# Patient Record
Sex: Female | Born: 2000 | Race: Asian | Hispanic: No | Marital: Single | Smoking: Never smoker
Health system: Southern US, Community
[De-identification: ages and names within clinical notes are randomized; demographics above are authoritative.]

## PROBLEM LIST (undated history)

## (undated) DIAGNOSIS — R109 Unspecified abdominal pain: Secondary | ICD-10-CM

## (undated) DIAGNOSIS — K5900 Constipation, unspecified: Secondary | ICD-10-CM

## (undated) DIAGNOSIS — J302 Other seasonal allergic rhinitis: Secondary | ICD-10-CM

## (undated) HISTORY — DX: Unspecified abdominal pain: R10.9

## (undated) HISTORY — DX: Constipation, unspecified: K59.00

## (undated) HISTORY — PX: GASTROSCHISIS CLOSURE: SHX1700

---
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
## 2021-06-02 ENCOUNTER — Ambulatory Visit
Admission: RE | Admit: 2021-06-02 | Discharge: 2021-06-02 | Disposition: A | Discharge status: Home / Self Care | Payer: Managed Care, Other (non HMO) | Source: Ambulatory Visit | Attending: Pediatrics | Admitting: Pediatrics

## 2021-06-02 ENCOUNTER — Other Ambulatory Visit: Payer: Self-pay | Admitting: Pediatrics

## 2021-06-02 DIAGNOSIS — R1084 Generalized abdominal pain: Secondary | ICD-10-CM

## 2022-01-04 ENCOUNTER — Observation Stay (HOSPITAL_COMMUNITY): Payer: Managed Care, Other (non HMO)

## 2022-01-04 ENCOUNTER — Other Ambulatory Visit: Payer: Self-pay

## 2022-01-04 ENCOUNTER — Emergency Department (HOSPITAL_COMMUNITY): Payer: Managed Care, Other (non HMO)

## 2022-01-04 ENCOUNTER — Inpatient Hospital Stay (HOSPITAL_COMMUNITY)
Admission: EM | Admit: 2022-01-04 | Discharge: 2022-01-06 | DRG: 388 | Disposition: A | Discharge status: Home / Self Care | Payer: Managed Care, Other (non HMO) | Attending: Surgery | Admitting: Surgery

## 2022-01-04 ENCOUNTER — Encounter (HOSPITAL_COMMUNITY): Payer: Self-pay

## 2022-01-04 DIAGNOSIS — Z79899 Other long term (current) drug therapy: Secondary | ICD-10-CM

## 2022-01-04 DIAGNOSIS — K5651 Intestinal adhesions [bands], with partial obstruction: Secondary | ICD-10-CM | POA: Diagnosis not present

## 2022-01-04 DIAGNOSIS — Z9049 Acquired absence of other specified parts of digestive tract: Secondary | ICD-10-CM

## 2022-01-04 DIAGNOSIS — U071 COVID-19: Secondary | ICD-10-CM | POA: Diagnosis present

## 2022-01-04 DIAGNOSIS — J302 Other seasonal allergic rhinitis: Secondary | ICD-10-CM | POA: Diagnosis present

## 2022-01-04 DIAGNOSIS — K5909 Other constipation: Secondary | ICD-10-CM | POA: Diagnosis present

## 2022-01-04 DIAGNOSIS — J069 Acute upper respiratory infection, unspecified: Secondary | ICD-10-CM | POA: Diagnosis present

## 2022-01-04 DIAGNOSIS — Z87761 Personal history of (corrected) gastroschisis: Secondary | ICD-10-CM

## 2022-01-04 DIAGNOSIS — Z6828 Body mass index (BMI) 28.0-28.9, adult: Secondary | ICD-10-CM

## 2022-01-04 DIAGNOSIS — K56609 Unspecified intestinal obstruction, unspecified as to partial versus complete obstruction: Secondary | ICD-10-CM | POA: Diagnosis not present

## 2022-01-04 DIAGNOSIS — E669 Obesity, unspecified: Secondary | ICD-10-CM | POA: Diagnosis present

## 2022-01-04 DIAGNOSIS — R0600 Dyspnea, unspecified: Secondary | ICD-10-CM | POA: Diagnosis present

## 2022-01-04 DIAGNOSIS — Z0189 Encounter for other specified special examinations: Secondary | ICD-10-CM

## 2022-01-04 LAB — BASIC METABOLIC PANEL
Anion gap: 9 (ref 5–15)
BUN: 9 mg/dL (ref 6–20)
CO2: 21 mmol/L — ABNORMAL LOW (ref 22–32)
Calcium: 8.9 mg/dL (ref 8.9–10.3)
Chloride: 105 mmol/L (ref 98–111)
Creatinine, Ser: 0.68 mg/dL (ref 0.44–1.00)
GFR, Estimated: >60 mL/min (ref >60)
Glucose, Bld: 108 mg/dL — ABNORMAL HIGH (ref 70–99)
Potassium: 4.2 mmol/L (ref 3.5–5.1)
Sodium: 135 mmol/L (ref 135–145)

## 2022-01-04 LAB — URINALYSIS, ROUTINE W REFLEX MICROSCOPIC
Bacteria, UA: NONE SEEN
Bilirubin Urine: NEGATIVE
Glucose, UA: NEGATIVE mg/dL
Hgb urine dipstick: NEGATIVE
Ketones, ur: 5 mg/dL — AB
Leukocytes,Ua: NEGATIVE
Nitrite: NEGATIVE
Protein, ur: 30 mg/dL — AB
Specific Gravity, Urine: 1.026 (ref 1.005–1.030)
pH: 5 (ref 5.0–8.0)

## 2022-01-04 LAB — COMPREHENSIVE METABOLIC PANEL
ALT: 34 U/L (ref 0–44)
AST: 22 U/L (ref 15–41)
Albumin: 4.3 g/dL (ref 3.5–5.0)
Alkaline Phosphatase: 48 U/L (ref 38–126)
Anion gap: 9 (ref 5–15)
BUN: 9 mg/dL (ref 6–20)
CO2: 20 mmol/L — ABNORMAL LOW (ref 22–32)
Calcium: 8.7 mg/dL — ABNORMAL LOW (ref 8.9–10.3)
Chloride: 107 mmol/L (ref 98–111)
Creatinine, Ser: 0.58 mg/dL (ref 0.44–1.00)
GFR, Estimated: >60 mL/min (ref >60)
Glucose, Bld: 122 mg/dL — ABNORMAL HIGH (ref 70–99)
Potassium: 3.7 mmol/L (ref 3.5–5.1)
Sodium: 136 mmol/L (ref 135–145)
Total Bilirubin: 0.5 mg/dL (ref 0.3–1.2)
Total Protein: 7.1 g/dL (ref 6.5–8.1)

## 2022-01-04 LAB — PROCALCITONIN: Procalcitonin: <0.1 ng/mL

## 2022-01-04 LAB — LIPASE, BLOOD: Lipase: 36 U/L (ref 11–51)

## 2022-01-04 LAB — CBC WITH DIFFERENTIAL/PLATELET
Abs Immature Granulocytes: 0.03 10*3/uL (ref 0.00–0.07)
Basophils Absolute: 0 10*3/uL (ref 0.0–0.1)
Basophils Relative: 0 %
Eosinophils Absolute: 0 10*3/uL (ref 0.0–0.5)
Eosinophils Relative: 0 %
HCT: 40.4 % (ref 36.0–46.0)
Hemoglobin: 13.4 g/dL (ref 12.0–15.0)
Immature Granulocytes: 0 %
Lymphocytes Relative: 16 %
Lymphs Abs: 2.4 10*3/uL (ref 0.7–4.0)
MCH: 27 pg (ref 26.0–34.0)
MCHC: 33.2 g/dL (ref 30.0–36.0)
MCV: 81.5 fL (ref 80.0–100.0)
Monocytes Absolute: 1.1 10*3/uL — ABNORMAL HIGH (ref 0.1–1.0)
Monocytes Relative: 7 %
Neutro Abs: 11.5 10*3/uL — ABNORMAL HIGH (ref 1.7–7.7)
Neutrophils Relative %: 77 %
Platelets: 310 10*3/uL (ref 150–400)
RBC: 4.96 MIL/uL (ref 3.87–5.11)
RDW: 13.4 % (ref 11.5–15.5)
WBC: 15.1 10*3/uL — ABNORMAL HIGH (ref 4.0–10.5)
nRBC: 0 % (ref 0.0–0.2)

## 2022-01-04 LAB — HCG, QUANTITATIVE, PREGNANCY: hCG, Beta Chain, Quant, S: <1 m[IU]/mL (ref <5)

## 2022-01-04 LAB — HIV ANTIBODY (ROUTINE TESTING W REFLEX): HIV Screen 4th Generation wRfx: NONREACTIVE

## 2022-01-04 LAB — CBC
HCT: 40.9 % (ref 36.0–46.0)
Hemoglobin: 13.5 g/dL (ref 12.0–15.0)
MCH: 26.7 pg (ref 26.0–34.0)
MCHC: 33 g/dL (ref 30.0–36.0)
MCV: 81 fL (ref 80.0–100.0)
Platelets: 296 10*3/uL (ref 150–400)
RBC: 5.05 MIL/uL (ref 3.87–5.11)
RDW: 13.4 % (ref 11.5–15.5)
WBC: 13.3 10*3/uL — ABNORMAL HIGH (ref 4.0–10.5)
nRBC: 0 % (ref 0.0–0.2)

## 2022-01-04 LAB — RESP PANEL BY RT-PCR (FLU A&B, COVID) ARPGX2
Influenza A by PCR: NEGATIVE
Influenza B by PCR: NEGATIVE
SARS Coronavirus 2 by RT PCR: POSITIVE — AB

## 2022-01-04 LAB — FIBRINOGEN: Fibrinogen: 467 mg/dL (ref 210–475)

## 2022-01-04 LAB — MAGNESIUM: Magnesium: 1.8 mg/dL (ref 1.7–2.4)

## 2022-01-04 LAB — C-REACTIVE PROTEIN: CRP: 4.7 mg/dL — ABNORMAL HIGH (ref <1.0)

## 2022-01-04 LAB — D-DIMER, QUANTITATIVE: D-Dimer, Quant: 0.36 ug/mL-FEU (ref 0.00–0.50)

## 2022-01-04 LAB — FERRITIN: Ferritin: 40 ng/mL (ref 11–307)

## 2022-01-04 MED ORDER — PHENOL 1.4 % MT LIQD
1.0000 | OROMUCOSAL | Status: DC | PRN
  Filled 2022-01-04: qty 177

## 2022-01-04 MED ORDER — DIPHENHYDRAMINE HCL 25 MG PO CAPS: 25.0000 mg | ORAL_CAPSULE | Freq: Four times a day (QID) | ORAL | Status: DC | PRN

## 2022-01-04 MED ORDER — BENZOCAINE 20 % MT AERO: INHALATION_SPRAY | Freq: Once | OROMUCOSAL | Status: AC

## 2022-01-04 MED ORDER — DIPHENHYDRAMINE HCL 50 MG/ML IJ SOLN: 25.0000 mg | Freq: Four times a day (QID) | INTRAMUSCULAR | Status: DC | PRN

## 2022-01-04 MED ORDER — BISACODYL 10 MG RE SUPP: 10.0000 mg | Freq: Every day | RECTAL | Status: DC | PRN

## 2022-01-04 MED ORDER — OXYMETAZOLINE HCL 0.05 % NA SOLN
1.0000 | Freq: Once | NASAL | Status: AC
  Administered 2022-01-04: 1 via NASAL
  Filled 2022-01-04: qty 30

## 2022-01-04 MED ORDER — SODIUM CHLORIDE 0.9 % IV BOLUS
500.0000 mL | Freq: Once | INTRAVENOUS | Status: AC
  Administered 2022-01-04: 500 mL via INTRAVENOUS

## 2022-01-04 MED ORDER — ONDANSETRON 4 MG PO TBDP
4.0000 mg | ORAL_TABLET | Freq: Four times a day (QID) | ORAL | Status: DC | PRN
  Administered 2022-01-04: 4 mg via ORAL
  Filled 2022-01-04: qty 1

## 2022-01-04 MED ORDER — IOHEXOL 350 MG/ML SOLN
80.0000 mL | Freq: Once | INTRAVENOUS | Status: AC | PRN
  Administered 2022-01-04: 80 mL via INTRAVENOUS

## 2022-01-04 MED ORDER — SODIUM CHLORIDE 0.9 % IV SOLN
200.0000 mg | Freq: Once | INTRAVENOUS | Status: AC
  Administered 2022-01-04: 200 mg via INTRAVENOUS
  Filled 2022-01-04: qty 40

## 2022-01-04 MED ORDER — DIATRIZOATE MEGLUMINE & SODIUM 66-10 % PO SOLN
90.0000 mL | Freq: Once | ORAL | Status: AC
  Administered 2022-01-04: 90 mL via NASOGASTRIC
  Filled 2022-01-04: qty 90

## 2022-01-04 MED ORDER — SODIUM CHLORIDE 0.9 % IV SOLN: INTRAVENOUS | Status: DC

## 2022-01-04 MED ORDER — SODIUM CHLORIDE 0.9 % IV SOLN
100.0000 mg | Freq: Every day | INTRAVENOUS | Status: DC
  Administered 2022-01-05: 100 mg via INTRAVENOUS
  Filled 2022-01-04 (×2): qty 20

## 2022-01-04 MED ORDER — BISACODYL 10 MG RE SUPP
10.0000 mg | Freq: Once | RECTAL | Status: AC
  Administered 2022-01-04: 10 mg via RECTAL
  Filled 2022-01-04: qty 1

## 2022-01-04 MED ORDER — METHOCARBAMOL 1000 MG/10ML IJ SOLN
500.0000 mg | Freq: Four times a day (QID) | INTRAVENOUS | Status: DC | PRN
  Filled 2022-01-04: qty 5

## 2022-01-04 MED ORDER — ALBUTEROL SULFATE HFA 108 (90 BASE) MCG/ACT IN AERS: 2.0000 | INHALATION_SPRAY | RESPIRATORY_TRACT | Status: DC | PRN

## 2022-01-04 MED ORDER — ONDANSETRON HCL 4 MG/2ML IJ SOLN
4.0000 mg | Freq: Four times a day (QID) | INTRAMUSCULAR | Status: DC | PRN
  Administered 2022-01-04: 4 mg via INTRAVENOUS
  Filled 2022-01-04: qty 2

## 2022-01-04 MED ORDER — ENOXAPARIN SODIUM 40 MG/0.4ML IJ SOSY
40.0000 mg | PREFILLED_SYRINGE | INTRAMUSCULAR | Status: DC
  Administered 2022-01-04 – 2022-01-06 (×3): 40 mg via SUBCUTANEOUS
  Filled 2022-01-04 (×3): qty 0.4

## 2022-01-04 MED ORDER — ONDANSETRON HCL 4 MG/2ML IJ SOLN
4.0000 mg | Freq: Once | INTRAMUSCULAR | Status: AC
  Administered 2022-01-04: 4 mg via INTRAVENOUS
  Filled 2022-01-04: qty 2

## 2022-01-04 MED ORDER — ACETAMINOPHEN 10 MG/ML IV SOLN
1000.0000 mg | Freq: Four times a day (QID) | INTRAVENOUS | Status: AC
  Administered 2022-01-04 – 2022-01-05 (×4): 1000 mg via INTRAVENOUS
  Filled 2022-01-04 (×4): qty 100

## 2022-01-04 MED ORDER — SODIUM CHLORIDE (PF) 0.9 % IJ SOLN
INTRAMUSCULAR | Status: AC
  Filled 2022-01-04: qty 50

## 2022-01-04 MED ORDER — MORPHINE SULFATE (PF) 2 MG/ML IV SOLN: 2.0000 mg | INTRAVENOUS | Status: DC | PRN

## 2022-01-04 MED ORDER — MENTHOL 3 MG MT LOZG
1.0000 | LOZENGE | OROMUCOSAL | Status: DC | PRN
  Filled 2022-01-04: qty 9

## 2022-01-04 MED ORDER — IOHEXOL 300 MG/ML  SOLN: 100.0000 mL | Freq: Once | INTRAMUSCULAR | Status: DC | PRN

## 2022-01-04 NOTE — H&P (Signed)
Surgical Evaluation Requesting provider: Quincy Carnes, PA-C  Chief Complaint: Bloating, emesis  HPI: 21 year old woman with history of gastroschisis/ repair, and laparoscopic cholecystectomy with 5 hour lysis of adhesions at age 35, who presents with a 5-day history of abdominal pain and bloating and a 1 day history of multiple episodes of emesis.  She does have previous bowel obstructions that did not require surgery- this is her third in about a year.  No Known Allergies  Past Medical History:  Diagnosis Date   Abdominal pain    Constipation    Seasonal allergies     Past Surgical History:  Procedure Laterality Date   GASTROSCHISIS CLOSURE      Family History  Problem Relation Age of Onset   Cholelithiasis Maternal Aunt    Cholelithiasis Maternal Grandmother     Social History   Socioeconomic History   Marital status: Single    Spouse name: Not on file   Number of children: Not on file   Years of education: Not on file   Highest education level: Not on file  Occupational History   Not on file  Tobacco Use   Smoking status: Never   Smokeless tobacco: Never  Substance and Sexual Activity   Alcohol use: Not on file   Drug use: Not on file   Sexual activity: Not on file  Other Topics Concern   Not on file  Social History Narrative   4th grade   Social Determinants of Health   Financial Resource Strain: Not on file  Food Insecurity: Not on file  Transportation Needs: Not on file  Physical Activity: Not on file  Stress: Not on file  Social Connections: Not on file    No current facility-administered medications on file prior to encounter.   Current Outpatient Medications on File Prior to Encounter  Medication Sig Dispense Refill   belladonna-PHENObarbital (DONNATAL) 16.2 MG/5ML ELIX Take 5 mLs (16.2 mg total) by mouth 3 (three) times daily as needed for cramping. 120 mL 0   FIBER SELECT GUMMIES CHEW Chew 2 each by mouth daily. 100 tablet 0   loratadine  (CLARITIN) 5 MG chewable tablet Chew 5 mg by mouth daily.     ondansetron (ZOFRAN) 4 MG tablet Take 1 tablet (4 mg total) by mouth every 8 (eight) hours as needed for nausea or vomiting. 12 tablet 0    Review of Systems: a complete, 10pt review of systems was completed with pertinent positives and negatives as documented in the HPI  Physical Exam: Vitals:   01/04/22 0210 01/04/22 0300  BP: 105/74 108/69  Pulse: (!) 106 94  Resp: 16 16  Temp: 99.3 F (37.4 C)   SpO2: 97% 98%   Gen: A&Ox3, no distress  Unlabored respirations Abdomen soft, mildly distended, mildly tender in upper fields.   CBC Latest Ref Rng & Units 01/04/2022 01/14/2013  WBC 4.0 - 10.5 K/uL 15.1(H) 12.1  Hemoglobin 12.0 - 15.0 g/dL 13.4 14.4  Hematocrit 36.0 - 46.0 % 40.4 42.5  Platelets 150 - 400 K/uL 310 347    CMP Latest Ref Rng & Units 01/04/2022 01/14/2013  Glucose 70 - 99 mg/dL 122(H) 103(H)  BUN 6 - 20 mg/dL 9 15  Creatinine 0.44 - 1.00 mg/dL 0.58 0.55  Sodium 135 - 145 mmol/L 136 142  Potassium 3.5 - 5.1 mmol/L 3.7 3.3(L)  Chloride 98 - 111 mmol/L 107 104  CO2 22 - 32 mmol/L 20(L) 23  Calcium 8.9 - 10.3 mg/dL 8.7(L) 9.7  Total Protein  6.5 - 8.1 g/dL 7.1 8.5(H)  Total Bilirubin 0.3 - 1.2 mg/dL 0.5 0.6  Alkaline Phos 38 - 126 U/L 48 220  AST 15 - 41 U/L 22 22  ALT 0 - 44 U/L 34 20    No results found for: INR, PROTIME  Imaging: CT ABDOMEN PELVIS W CONTRAST  Result Date: 01/04/2022 CLINICAL DATA:  Abdominal pain, bloating, nausea. Bowel obstruction suspected bloating, history of SBO EXAM: CT ABDOMEN AND PELVIS WITH CONTRAST TECHNIQUE: Multidetector CT imaging of the abdomen and pelvis was performed using the standard protocol following bolus administration of intravenous contrast. RADIATION DOSE REDUCTION: This exam was performed according to the departmental dose-optimization program which includes automated exposure control, adjustment of the mA and/or kV according to patient size and/or use of  iterative reconstruction technique. CONTRAST:  75mL OMNIPAQUE IOHEXOL 350 MG/ML SOLN COMPARISON:  01/14/2013 FINDINGS: Lower chest: No acute abnormality. Hepatobiliary: No focal hepatic abnormality. Gallbladder unremarkable. Pancreas: No focal abnormality or ductal dilatation. Spleen: No focal abnormality.  Normal size. Adrenals/Urinary Tract: No adrenal abnormality. No focal renal abnormality. No stones or hydronephrosis. Urinary bladder is unremarkable. Stomach/Bowel: Large stool burden in the colon, particularly left colon. Mildly dilated upper and mid abdominal small bowel loops. Distal small bowel loops decompressed. Findings concerning for partial small bowel obstruction. Vascular/Lymphatic: No evidence of aneurysm or adenopathy. Reproductive: Uterus and adnexa unremarkable.  No mass. Other: Small amount of free fluid in the pelvis.  No free air. Musculoskeletal: No acute bony abnormality. IMPRESSION: Dilated proximal and mid small bowel loops with air-fluid levels concerning for partial small bowel obstruction. Large stool burden throughout the colon. Electronically Signed   By: Rolm Baptise M.D.   On: 01/04/2022 03:06     A/P: Small bowel obstruction in the setting of multiple previous abdominal surgeries/ known adhesive disease.  Admit for small bowel obstruction protocol. Patient inquiring about recommendations for tertiary surgeon to consider elective LOA if episodes persist, had her pediatric care at [facility]; will see if any of our partners who trained there have suggestions.    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

## 2022-01-04 NOTE — ED Provider Notes (Signed)
[facility]-EMERGENCY DEPT Provider Note   CSN: [id number] Arrival date & time: 01/04/22  0000     History  Chief Complaint  Patient presents with   Abdominal Pain   Nausea    Sabrina Pierce is a 21 y.o. female.  The history is provided by the patient and medical records.  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  Started vomiting last night, last emesis about 10 minutes prior to room assignment.  Does have history of bowel obstruction in the past.  She denies any fever.  No diarrhea.  No urinary symptoms.  Home Medications Prior to Admission medications   Medication Sig Start Date End Date Taking? Authorizing Provider  belladonna-PHENObarbital (DONNATAL) 16.2 MG/5ML ELIX Take 5 mLs (16.2 mg total) by mouth 3 (three) times daily as needed for cramping. 12/22/13   Reuben Likes, MD  FIBER SELECT GUMMIES CHEW Chew 2 each by mouth daily. 03/19/13 03/19/14  Jon Gills, MD  loratadine (CLARITIN) 5 MG chewable tablet Chew 5 mg by mouth daily.    [provider]  ondansetron (ZOFRAN) 4 MG tablet Take 1 tablet (4 mg total) by mouth every 8 (eight) hours as needed for nausea or vomiting. 12/22/13   Reuben Likes, MD      Allergies    Patient has no known allergies.    Review of Systems   Review of Systems  Gastrointestinal:  Positive for abdominal pain, nausea and vomiting.  All other systems reviewed and are negative.  Physical Exam Updated Vital Signs BP 108/69    Pulse 94    Temp 99.3 F (37.4 C) (Oral)    Resp 16    Ht 5\' 4"  (1.626 m)    Wt 75.3 kg    SpO2 98%    BMI 28.49 kg/m   Physical Exam Vitals and nursing note reviewed.  Constitutional:      Appearance: She is well-developed.  HENT:     Head: Normocephalic and atraumatic.  Eyes:     Conjunctiva/sclera: Conjunctivae  normal.     Pupils: Pupils are equal, round, and reactive to light.  Cardiovascular:     Rate and Rhythm: Normal rate and regular rhythm.     Heart sounds: Normal heart sounds.  Pulmonary:     Effort: Pulmonary effort is normal.     Breath sounds: Normal breath sounds.  Abdominal:     General: Bowel sounds are normal.     Palpations: Abdomen is soft.     Tenderness: There is no abdominal tenderness.     Comments: No significant distention appreciated, overall non-tender to palpation  Musculoskeletal:        General: Normal range of motion.     Cervical back: Normal range of motion.  Skin:    General: Skin is warm and dry.  Neurological:     Mental Status: She is alert and oriented to person, place, and time.    ED Results / Procedures / Treatments   Labs (all labs ordered are listed, but only abnormal results are displayed) Labs Reviewed  COMPREHENSIVE METABOLIC PANEL - Abnormal; Notable for the following components:      Result Value   CO2 20 (*)    Glucose, Bld 122 (*)    Calcium 8.7 (*)    All other components within normal limits  CBC WITH DIFFERENTIAL/PLATELET - Abnormal; Notable for the following components:   WBC 15.1 (*)    Neutro Abs 11.5 (*)    Monocytes Absolute 1.1 (*)    All other components within normal limits  URINALYSIS, ROUTINE W REFLEX MICROSCOPIC - Abnormal; Notable for the following components:   APPearance HAZY (*)    Ketones, ur 5 (*)    Protein, ur 30 (*)    All other components within normal limits  RESP PANEL BY RT-PCR (FLU A&B, COVID) ARPGX2  LIPASE, BLOOD  HCG, QUANTITATIVE, PREGNANCY  HIV ANTIBODY (ROUTINE TESTING W REFLEX)  BASIC METABOLIC PANEL  MAGNESIUM  CBC    EKG None  Radiology CT ABDOMEN PELVIS W CONTRAST  Result Date: 01/04/2022 CLINICAL DATA:  Abdominal pain, bloating, nausea. Bowel obstruction suspected bloating, history of SBO EXAM: CT ABDOMEN AND PELVIS WITH CONTRAST TECHNIQUE: Multidetector CT imaging of the abdomen  and pelvis was performed using the standard protocol following bolus administration of intravenous contrast. RADIATION DOSE REDUCTION: This exam was performed according to the departmental dose-optimization program which includes automated exposure control, adjustment of the mA and/or kV according to patient size and/or use of iterative reconstruction technique. CONTRAST:  72mL OMNIPAQUE IOHEXOL 350 MG/ML SOLN COMPARISON:  01/14/2013 FINDINGS: Lower chest: No acute abnormality. Hepatobiliary: No focal hepatic abnormality. Gallbladder unremarkable. Pancreas: No focal abnormality or ductal dilatation. Spleen: No focal abnormality.  Normal size. Adrenals/Urinary Tract: No adrenal abnormality. No focal renal abnormality. No stones or hydronephrosis. Urinary bladder is unremarkable. Stomach/Bowel: Large stool burden in the colon, particularly left colon. Mildly dilated upper and mid abdominal small bowel loops. Distal small bowel loops decompressed. Findings concerning for partial small bowel obstruction. Vascular/Lymphatic: No evidence of aneurysm or adenopathy. Reproductive: Uterus and adnexa unremarkable.  No mass. Other: Small amount of free fluid in the pelvis.  No free air. Musculoskeletal: No acute bony abnormality. IMPRESSION: Dilated proximal and mid small bowel loops with air-fluid levels concerning for partial small bowel obstruction. Large stool burden throughout the colon. Electronically Signed   By: Charlett Nose M.D.   On: 01/04/2022 03:06    Procedures Procedures    Medications Ordered in ED Medications  sodium chloride (PF) 0.9 % injection (has no administration in time range)  0.9 %  sodium chloride infusion (has no administration in time range)  ondansetron (ZOFRAN-ODT) disintegrating tablet 4 mg (has no administration in time range)    Or  ondansetron (ZOFRAN) injection 4 mg (has no administration in time range)  bisacodyl (DULCOLAX) suppository 10 mg (has no administration in time range)   diphenhydrAMINE (BENADRYL) capsule 25 mg (has no administration in time range)    Or  diphenhydrAMINE (BENADRYL) injection 25 mg (has no administration in time range)  diatrizoate meglumine-sodium (GASTROGRAFIN) 66-10 % solution 90 mL (has no administration in time range)  Benzocaine (HURRCAINE) 20 % mouth spray (has no administration in time range)  ondansetron (ZOFRAN) injection 4 mg (4 mg Intravenous Given 01/04/22 0217)  iohexol (OMNIPAQUE) 350 MG/ML injection 80 mL (80 mLs Intravenous Contrast Given 01/04/22 0235)    ED Course/ Medical Decision Making/ A&P                           Medical Decision Making Amount and/or Complexity of Data Reviewed External Data Reviewed: labs, radiology and notes. Labs: ordered. Radiology: ordered and independent interpretation performed. ECG/medicine tests: ordered and independent interpretation performed.  Risk Prescription drug management. Decision regarding hospitalization.   21 year old  female presenting to the ED with abdominal bloating and vomiting.  Symptoms worsening over the past 5 days.  Has history of SBO and states this feels similar.  She is afebrile and nontoxic in appearance.  She did vomit x1 prior to my assessment.  Abdomen is overall soft.  Labs with leukocytosis of 15,000, no electrolyte imbalance.  Lipase is normal.  Given her history of recurrent SBO, will obtain CT scan.  She is given Zofran.  CT reviewed, findings of partial SBO.  Will place NG tube.  Results discussed with patient and mother at bedside, they acknowledge understanding.  Will admit for ongoing care.   Discussed with on-call general surgery, Dr. Doylene Canard-- has evaluated in the ED and will admit.  Final Clinical Impression(s) / ED Diagnoses Final diagnoses:  Intestinal adhesions with partial obstruction [facility])    Rx / DC Orders ED Discharge Orders     None         Garlon Hatchet, PA-C 01/04/22 0405    Geoffery Lyons, MD 01/04/22 [phone]

## 2022-01-04 NOTE — ED Notes (Signed)
Pt given two ice packs upon request.

## 2022-01-04 NOTE — ED Triage Notes (Signed)
Pt states that she had bloating x 5 days and last night she started having abdominal pain and nausea. Pt states that she has a history of bowel obstruction.

## 2022-01-04 NOTE — Consult Note (Signed)
Initial Consultation Note   Patient: Sabrina Pierce FXJ:[id number] DOB: 04/16/2001 PCP: Pcp, No DOA: 01/04/2022 DOS: the patient was seen and examined on 01/04/2022 Primary service: Montez Morita, Md, MD  Referring physician: [facility] Reason for consult: COVID +  Assessment and Plan: Principal Problem:   Small bowel obstruction (HCC) Keep NPO. Continue NGT softening. Continue IV fluids. Analgesics as needed. Antiemetics as needed. Keep electrolytes optimized. [facility] is following.  Active Problems:   Acute respiratory disease due to COVID-19 virus Has been dyspneic at times. However, no oxygen requirement presently. Check inflammatory markers. Currently n.p.o. so will start remdesivir per pharmacy. Bronchodilators as needed. Antitussive as needed (IV narcotic/IV antiemetic for now). Follow-up CBC, CMP and inflammatory markers in a.m.  TR[facility] will continue to follow the patient.  HPI: Sabrina Pierce is a 21 y.o. female with past medical history of abdominal pain, constipation and seasonal allergies who was admitted by surgical service due to small bowel obstruction.  We are evaluating think the patient is having URI symptoms, malaise and dyspnea in the setting acute respiratory illness due to COVID.  History given by her mother due to the patient having sore throat and difficulty walking with the NG tube placement.  She has pain all yes there is no to questions I have being asked.  Review of Systems: As mentioned in the history of present illness. All other systems reviewed and are negative. Past Medical History:  Diagnosis Date   Abdominal pain    Constipation    Seasonal allergies    Past Surgical History:  Procedure Laterality Date   GASTROSCHISIS CLOSURE     Social History:  reports that she has never smoked. She has never used smokeless tobacco. No history on file for alcohol use and drug use.  No Known Allergies  Family History  Problem Relation Age of Onset   Cholelithiasis Maternal Aunt     Cholelithiasis Maternal Grandmother     Prior to Admission medications   Medication Sig Start Date End Date Taking? Authorizing Provider  FIBER SELECT GUMMIES CHEW Chew 2 each by mouth daily. Patient not taking: Reported on 01/04/2022 03/19/13 03/19/14  Jon Gills, MD    Physical Exam: Vitals:   01/04/22 1100 01/04/22 1104 01/04/22 1136 01/04/22 1200  BP: 90/68  96/69 93/63  Pulse: (!) 113  99 (!) 107  Resp:  19 18 16   Temp:  98.4 F (36.9 C)    TempSrc:  Oral    SpO2: 96%  100% 96%  Weight:      Height:       Physical Exam Vitals and nursing note reviewed. Exam conducted with a chaperone present.  Constitutional:      Appearance: She is obese. She is ill-appearing.  HENT:     Head: Normocephalic and atraumatic.     Mouth/Throat:     Mouth: Mucous membranes are moist.     Comments: NGT in place. Eyes:     Pupils: Pupils are equal, round, and reactive to light.  Cardiovascular:     Rate and Rhythm: Normal rate and regular rhythm.     Heart sounds: Normal heart sounds. No murmur heard. Pulmonary:     Effort: Pulmonary effort is normal.     Breath sounds: Normal breath sounds.  Abdominal:     General: Abdomen is flat. There is no distension.     Palpations: Abdomen is soft.     Tenderness: There is abdominal tenderness.  Skin:    General: Skin is warm and  dry.  Neurological:     General: No focal deficit present.     Mental Status: She is alert and oriented to person, place, and time.    Data Reviewed:   Results are pending, will review when available.  Family Communication:  Primary team communication:  Thank you very much for involving [place] in the care of your patient.  Author: Bobette Mo, MD 01/04/2022 12:29 PM  For on call review [email or web address].   This document was prepared using Dragon voice recognition software and may contain some unintended transcription errors.

## 2022-01-04 NOTE — Progress Notes (Signed)
Seen earlier this AM by Dr. Fredricka Bonine. Please see her consult note for more details.   S Patient reports prior to admission she had left sided abdominal pain, persistent nausea and multiple episodes of emesis. Since NGT placement her abdominal pain and nausea have resolved. She is still passing flatus (was prior to admission). She had a very firm, large bm yesterday before symptom onset. No BM since admission. Reports she frequently struggles with constipation.   Patient found to be COVID +. She had some sob and nasal congestion prior to admission. No CP or sob here. On RA. Febrile earlier today. Resolved after Tylenol. Has had 2 doses of covid vaccines. Has not received booster yet.   O Blood pressure 90/68, pulse (!) 113, temperature 98.4 F (36.9 C), temperature source Oral, resp. rate 19, height 5\' 4"  (1.626 m), weight 75.3 kg, SpO2 96 %. Gen:  Alert, NAD, pleasant HEENT: EOM's intact, pupils equal and round Card:  Tachycardic with regular rhythm Pulm:  CTAB, no W/R/R, effort normal. On RA.  Abd: Very soft, mild distension, mild periumbilical/epigastric and left sided abdominal tenderness without peritonitis. Slightly hypoactive bs. NGT w/ bilious output.   Ext:  No LE edema  Psych: A&Ox3  Skin: no rashes noted, warm and dry   A/P SBO - No indication for emergency surgery at this time - Cont NPO and NGT decompression - SBO protocol initiated. Follow up on 8 hour delayed film - Keep K > 4 and Mg > 2 for bowel function. AM labs - Mobilize for bowel function - Hopefully patient will improve with conservative management. If she fails to improve with conservative management, she may require exploration during admission.  - Patient's mother inquiring if patient was to improve with conservative management if we could refer to tertiary center as outpatient to discuss possible elective LOA.   FEN: NPO, NGT. 500cc IVF bolus followed by maintence fluid at 125cc/hr. Suppository  VTE: SCDs,  Lovenox  ID: None currently.  Foley: None   COVID + - Febrile and tachy here. Reports sob prior to admission. On RA. Pulm toilet. CXR. TRH consult.   , [facility] 11:21 AM, 01/04/22

## 2022-01-05 ENCOUNTER — Observation Stay (HOSPITAL_COMMUNITY): Payer: Managed Care, Other (non HMO)

## 2022-01-05 DIAGNOSIS — Z9049 Acquired absence of other specified parts of digestive tract: Secondary | ICD-10-CM | POA: Diagnosis not present

## 2022-01-05 DIAGNOSIS — J069 Acute upper respiratory infection, unspecified: Secondary | ICD-10-CM | POA: Diagnosis not present

## 2022-01-05 DIAGNOSIS — K5651 Intestinal adhesions [bands], with partial obstruction: Secondary | ICD-10-CM | POA: Diagnosis present

## 2022-01-05 DIAGNOSIS — U071 COVID-19: Secondary | ICD-10-CM

## 2022-01-05 DIAGNOSIS — Z6828 Body mass index (BMI) 28.0-28.9, adult: Secondary | ICD-10-CM | POA: Diagnosis not present

## 2022-01-05 DIAGNOSIS — R0600 Dyspnea, unspecified: Secondary | ICD-10-CM | POA: Diagnosis present

## 2022-01-05 DIAGNOSIS — K5909 Other constipation: Secondary | ICD-10-CM | POA: Diagnosis present

## 2022-01-05 DIAGNOSIS — Z79899 Other long term (current) drug therapy: Secondary | ICD-10-CM | POA: Diagnosis not present

## 2022-01-05 DIAGNOSIS — Z87761 Personal history of (corrected) gastroschisis: Secondary | ICD-10-CM | POA: Diagnosis not present

## 2022-01-05 DIAGNOSIS — E669 Obesity, unspecified: Secondary | ICD-10-CM | POA: Diagnosis present

## 2022-01-05 DIAGNOSIS — K56609 Unspecified intestinal obstruction, unspecified as to partial versus complete obstruction: Secondary | ICD-10-CM | POA: Diagnosis present

## 2022-01-05 DIAGNOSIS — J302 Other seasonal allergic rhinitis: Secondary | ICD-10-CM | POA: Diagnosis present

## 2022-01-05 LAB — COMPREHENSIVE METABOLIC PANEL
ALT: 26 U/L (ref 0–44)
AST: 16 U/L (ref 15–41)
Albumin: 3.6 g/dL (ref 3.5–5.0)
Alkaline Phosphatase: 41 U/L (ref 38–126)
Anion gap: 6 (ref 5–15)
BUN: 9 mg/dL (ref 6–20)
CO2: 23 mmol/L (ref 22–32)
Calcium: 8.3 mg/dL — ABNORMAL LOW (ref 8.9–10.3)
Chloride: 111 mmol/L (ref 98–111)
Creatinine, Ser: 0.41 mg/dL — ABNORMAL LOW (ref 0.44–1.00)
GFR, Estimated: >60 mL/min (ref >60)
Glucose, Bld: 83 mg/dL (ref 70–99)
Potassium: 3.5 mmol/L (ref 3.5–5.1)
Sodium: 140 mmol/L (ref 135–145)
Total Bilirubin: 0.6 mg/dL (ref 0.3–1.2)
Total Protein: 6.3 g/dL — ABNORMAL LOW (ref 6.5–8.1)

## 2022-01-05 LAB — CBC WITH DIFFERENTIAL/PLATELET
Abs Immature Granulocytes: 0.02 10*3/uL (ref 0.00–0.07)
Basophils Absolute: 0 10*3/uL (ref 0.0–0.1)
Basophils Relative: 0 %
Eosinophils Absolute: 0.1 10*3/uL (ref 0.0–0.5)
Eosinophils Relative: 2 %
HCT: 36.4 % (ref 36.0–46.0)
Hemoglobin: 11.3 g/dL — ABNORMAL LOW (ref 12.0–15.0)
Immature Granulocytes: 0 %
Lymphocytes Relative: 28 %
Lymphs Abs: 1.7 10*3/uL (ref 0.7–4.0)
MCH: 26.5 pg (ref 26.0–34.0)
MCHC: 31 g/dL (ref 30.0–36.0)
MCV: 85.4 fL (ref 80.0–100.0)
Monocytes Absolute: 0.8 10*3/uL (ref 0.1–1.0)
Monocytes Relative: 13 %
Neutro Abs: 3.3 10*3/uL (ref 1.7–7.7)
Neutrophils Relative %: 57 %
Platelets: 229 10*3/uL (ref 150–400)
RBC: 4.26 MIL/uL (ref 3.87–5.11)
RDW: 13.4 % (ref 11.5–15.5)
WBC: 5.9 10*3/uL (ref 4.0–10.5)
nRBC: 0 % (ref 0.0–0.2)

## 2022-01-05 LAB — PHOSPHORUS: Phosphorus: 3.2 mg/dL (ref 2.5–4.6)

## 2022-01-05 LAB — D-DIMER, QUANTITATIVE: D-Dimer, Quant: <0.27 ug/mL-FEU (ref 0.00–0.50)

## 2022-01-05 LAB — FERRITIN: Ferritin: 48 ng/mL (ref 11–307)

## 2022-01-05 LAB — MAGNESIUM: Magnesium: 2 mg/dL (ref 1.7–2.4)

## 2022-01-05 LAB — C-REACTIVE PROTEIN: CRP: 4.4 mg/dL — ABNORMAL HIGH (ref <1.0)

## 2022-01-05 MED ORDER — FLEET ENEMA 7-19 GM/118ML RE ENEM
1.0000 | ENEMA | Freq: Once | RECTAL | Status: AC
  Administered 2022-01-05: 1 via RECTAL
  Filled 2022-01-05: qty 1

## 2022-01-05 MED ORDER — ACETAMINOPHEN 500 MG PO TABS: 1000.0000 mg | ORAL_TABLET | Freq: Four times a day (QID) | ORAL | Status: DC | PRN

## 2022-01-05 MED ORDER — POTASSIUM CHLORIDE 10 MEQ/100ML IV SOLN
10.0000 meq | INTRAVENOUS | Status: AC
  Administered 2022-01-05 (×4): 10 meq via INTRAVENOUS
  Filled 2022-01-05 (×3): qty 100

## 2022-01-05 NOTE — Progress Notes (Addendum)
Subjective: CC: Doing well. Abdominal pain has resolved. No nausea. She is still passing flatus with 2 small episodes since I saw her yesterday morning. BM after suppository yesterday that was multiple small hard pieces. Mobilizing in the room. NGT w/ 1.225L/24 hours, bilious.   Objective: Vital signs in last 24 hours: Temp:  [98.1 F (36.7 C)-98.9 F (37.2 C)] 98.6 F (37 C) (02/09 0610) Pulse Rate:  [63-121] 63 (02/09 0610) Resp:  [15-20] 16 (02/09 0610) BP: (90-126)/(60-88) 116/60 (02/09 0610) SpO2:  [93 %-100 %] 100 % (02/09 0610) Last BM Date: 01/04/22  Intake/Output from previous day: 02/08 0701 - 02/09 0700 In: 3453.5 [I.V.:2502.2; IV Piggyback:951.3] Out: 1225 [Emesis/NG output:1225] Intake/Output this shift: No intake/output data recorded.  PE: Gen:  Alert, NAD, pleasant HEENT: EOM's intact, pupils equal and round Card:  Reg Pulm: Normal rate and effort. On RA.  Abd: Soft, ND, very mild tenderness right of her umbilicus and in the LLQ. No peritonitis. +BS. NGT output bilious with 1.225L/24 hours Ext:  No LE edema  Psych: A&Ox3  Skin: no rashes noted, warm and dry  Lab Results:  Recent Labs    01/04/22 0350 01/05/22 0356  WBC 13.3* 5.9  HGB 13.5 11.3*  HCT 40.9 36.4  PLT 296 229   BMET Recent Labs    01/04/22 0350 01/05/22 0356  NA 135 140  K 4.2 3.5  CL 105 111  CO2 21* 23  GLUCOSE 108* 83  BUN 9 9  CREATININE 0.68 0.41*  CALCIUM 8.9 8.3*   PT/INR No results for input(s): LABPROT, INR in the last 72 hours. CMP     Component Value Date/Time   NA 140 01/05/2022 0356   K 3.5 01/05/2022 0356   CL 111 01/05/2022 0356   CO2 23 01/05/2022 0356   GLUCOSE 83 01/05/2022 0356   BUN 9 01/05/2022 0356   CREATININE 0.41 (L) 01/05/2022 0356   CALCIUM 8.3 (L) 01/05/2022 0356   PROT 6.3 (L) 01/05/2022 0356   ALBUMIN 3.6 01/05/2022 0356   AST 16 01/05/2022 0356   ALT 26 01/05/2022 0356   ALKPHOS 41 01/05/2022 0356   BILITOT 0.6 01/05/2022  0356   GFRNONAA >60 01/05/2022 0356   GFRAA NOT CALCULATED 01/14/2013 1248   Lipase     Component Value Date/Time   LIPASE 36 01/04/2022 0024    Studies/Results: CT ABDOMEN PELVIS W CONTRAST  Result Date: 01/04/2022 CLINICAL DATA:  Abdominal pain, bloating, nausea. Bowel obstruction suspected bloating, history of SBO EXAM: CT ABDOMEN AND PELVIS WITH CONTRAST TECHNIQUE: Multidetector CT imaging of the abdomen and pelvis was performed using the standard protocol following bolus administration of intravenous contrast. RADIATION DOSE REDUCTION: This exam was performed according to the departmental dose-optimization program which includes automated exposure control, adjustment of the mA and/or kV according to patient size and/or use of iterative reconstruction technique. CONTRAST:  108mL OMNIPAQUE IOHEXOL 350 MG/ML SOLN COMPARISON:  01/14/2013 FINDINGS: Lower chest: No acute abnormality. Hepatobiliary: No focal hepatic abnormality. Gallbladder unremarkable. Pancreas: No focal abnormality or ductal dilatation. Spleen: No focal abnormality.  Normal size. Adrenals/Urinary Tract: No adrenal abnormality. No focal renal abnormality. No stones or hydronephrosis. Urinary bladder is unremarkable. Stomach/Bowel: Large stool burden in the colon, particularly left colon. Mildly dilated upper and mid abdominal small bowel loops. Distal small bowel loops decompressed. Findings concerning for partial small bowel obstruction. Vascular/Lymphatic: No evidence of aneurysm or adenopathy. Reproductive: Uterus and adnexa unremarkable.  No mass. Other: Small amount of  free fluid in the pelvis.  No free air. Musculoskeletal: No acute bony abnormality. IMPRESSION: Dilated proximal and mid small bowel loops with air-fluid levels concerning for partial small bowel obstruction. Large stool burden throughout the colon. Electronically Signed   By: Rolm Baptise M.D.   On: 01/04/2022 03:06   DG CHEST PORT 1 VIEW  Result Date:  01/04/2022 CLINICAL DATA:  8 hour delay abdominal x-ray for small-bowel obstruction. EXAM: PORTABLE CHEST 1 VIEW COMPARISON:  None. FINDINGS: Cardiac silhouette and mediastinal contours are within normal limits. The lungs are clear. No pleural effusion or pneumothorax. No acute skeletal abnormality. Enteric tube descends below the diaphragm with the tip overlying the stomach in the left upper quadrant. IMPRESSION: No active pulmonary disease. Enteric tube descends below the diaphragm with the tip overlying the stomach in the left upper quadrant. Electronically Signed   By: Yvonne Kendall M.D.   On: 01/04/2022 14:15   DG Abd Portable 1V-Small Bowel Obstruction Protocol-24 hr delay  Result Date: 01/05/2022 CLINICAL DATA:  Follow-up small bowel obstruction. EXAM: PORTABLE ABDOMEN - 1 VIEW COMPARISON:  Most recent film yesterday at 1:54 p.m. FINDINGS: 4:59 a.m., 01/05/2022. Enteric tube terminates in the body of the stomach. No dilated small bowel is seen on the current exam. This could indicate either decompression of the previously dilated small bowel or fluid filling of it. There is dense stool in the normal caliber colon, with aeration in the colon least as far as the distal descending colon. Visceral shadows are stable. IMPRESSION: Dilated small bowel is not seen on current exam. This could indicate fluid filling of dilated bowel or decompression. There is increasingly dense stool in the normal caliber colon. Electronically Signed   By: Telford Nab M.D.   On: 01/05/2022 07:08   DG Abd Portable 1V-Small Bowel Obstruction Protocol-initial, 8 hr delay  Result Date: 01/04/2022 CLINICAL DATA:  Follow-up small bowel obstruction EXAM: PORTABLE ABDOMEN - 1 VIEW COMPARISON:  07/04/2022 at 0342 hours FINDINGS: NG tube is not seen, and may have been removed or is located outside of the field of view. Mildly dilated small bowel within the left upper quadrant measuring up to 2.9 cm in diameter, improving from prior  (previously measured 5.4 cm). Air is seen throughout the colon. Moderate colonic stool volume. No gross free intraperitoneal air. IMPRESSION: 1. Improving small bowel dilation suggesting resolving obstruction. 2. NG tube is not seen, and may have been removed or is located outside of the field of view. Correlate with exam. Electronically Signed   By: Davina Poke D.O.   On: 01/04/2022 14:16   DG Abd Portable 1V-Small Bowel Protocol-Position Verification  Result Date: 01/04/2022 CLINICAL DATA:  21 year old female status post nasogastric tube placement. EXAM: PORTABLE ABDOMEN - 1 VIEW COMPARISON:  Abdominal radiograph 06/02/2021. FINDINGS: Tip of nasogastric tube is in the proximal stomach with side port at or immediately beyond the gastroesophageal junction. Several several dilated loops of small bowel are noted in the left upper quadrant of the abdomen measuring up to 5.4 cm in diameter. Gas and stool is noted throughout the colon and rectum. Iodinated contrast material is noted within the collecting systems of both kidneys and in the ureters and urinary bladder, related to recent contrast enhanced CT examination. IMPRESSION: 1. Nasogastric tube is in the stomach but could be advanced approximately 10 cm for more optimal placement. 2. Persistent evidence of small bowel obstruction, as above. Electronically Signed   By: Vinnie Langton M.D.   On: 01/04/2022 05:03  Anti-infectives: Anti-infectives (From admission, onward)    Start     Dose/Rate Route Frequency Ordered Stop   01/05/22 1000  remdesivir 100 mg in sodium chloride 0.9 % 100 mL IVPB       See Hyperspace for full Linked Orders Report.   100 mg 200 mL/hr over 30 Minutes Intravenous Daily 01/04/22 1228 01/09/22 0959   01/04/22 1400  remdesivir 200 mg in sodium chloride 0.9% 250 mL IVPB       See Hyperspace for full Linked Orders Report.   200 mg 580 mL/hr over 30 Minutes Intravenous Once 01/04/22 1228 01/04/22 1415         Assessment/Plan SBO - No indication for emergency surgery at this time - SBO protocol initiated. Xray this morning without dilated small bowel visualized. Could be fluid filled. Patient with improvement in her symptoms, continues to pass flatus and with reassuring exam. NGT is still bilious however with > 1L in the last 24 hours so will do clamping trial and check residuals.  - Keep K > 4 and Mg > 2 for bowel function - Mobilize for bowel function - Hopefully patient will improve with conservative management. If she fails to improve with conservative management, she may require exploration during admission.  - Patient's mother inquired if patient was to improve with conservative management if we could refer to tertiary center as outpatient to discuss possible elective LOA.    FEN: NPO, NGT. IVF. Enema. Replace K VTE: SCDs, Lovenox  ID: Remdesivir. WBC wnl.  Foley: None    COVID + - TRH following. On Remdesivir. On RA.   This care required straightforward level of medical decision making.    LOS: 0 days    Jillyn Ledger , [facility] 01/05/2022, 8:56 AM Please see Amion for pager number during day hours 7:00am-4:30pm

## 2022-01-05 NOTE — Progress Notes (Signed)
[facility] PROGRESS NOTE    Progress Note  Sabrina Pierce  [id number] DOB: 18-Jan-2001 DOA: 01/04/2022 PCP: Pcp, No     Brief Narrative:   Sabrina Pierce is an 21 y.o. female past medical history of abdominal pain constipation seasonal allergies for small bowel obstruction we are consulted for upper respiratory symptoms was found to have incidental COVID-19 SARS-CoV-2 PCR positive      Assessment/Plan:   Small bowel obstruction ([place]) Per surgery.  Acute respiratory disease due to COVID-19 virus Greater than 98% on room air. Tmax of 101.2, started on IV remdesivir no indication for steroids. Completely asymptomatic denies any productive cough, no fevers today. No shortness of breath with ambulation.     DVT prophylaxis: lovenox Family Communication:none         Code Status:     Code Status Orders  (From admission, onward)           Start     Ordered   01/04/22 0322  Full code  Continuous        01/04/22 0323           Code Status History     This patient has a current code status but no historical code status.         IV Access:   Peripheral IV   Procedures and diagnostic studies:   CT ABDOMEN PELVIS W CONTRAST  Result Date: 01/04/2022 CLINICAL DATA:  Abdominal pain, bloating, nausea. Bowel obstruction suspected bloating, history of SBO EXAM: CT ABDOMEN AND PELVIS WITH CONTRAST TECHNIQUE: Multidetector CT imaging of the abdomen and pelvis was performed using the standard protocol following bolus administration of intravenous contrast. RADIATION DOSE REDUCTION: This exam was performed according to the departmental dose-optimization program which includes automated exposure control, adjustment of the mA and/or kV according to patient size and/or use of iterative reconstruction technique. CONTRAST:  55mL OMNIPAQUE IOHEXOL 350 MG/ML SOLN COMPARISON:  01/14/2013 FINDINGS: Lower chest: No acute abnormality. Hepatobiliary: No focal hepatic  abnormality. Gallbladder unremarkable. Pancreas: No focal abnormality or ductal dilatation. Spleen: No focal abnormality.  Normal size. Adrenals/Urinary Tract: No adrenal abnormality. No focal renal abnormality. No stones or hydronephrosis. Urinary bladder is unremarkable. Stomach/Bowel: Large stool burden in the colon, particularly left colon. Mildly dilated upper and mid abdominal small bowel loops. Distal small bowel loops decompressed. Findings concerning for partial small bowel obstruction. Vascular/Lymphatic: No evidence of aneurysm or adenopathy. Reproductive: Uterus and adnexa unremarkable.  No mass. Other: Small amount of free fluid in the pelvis.  No free air. Musculoskeletal: No acute bony abnormality. IMPRESSION: Dilated proximal and mid small bowel loops with air-fluid levels concerning for partial small bowel obstruction. Large stool burden throughout the colon. Electronically Signed   By: Rolm Baptise M.D.   On: 01/04/2022 03:06   DG CHEST PORT 1 VIEW  Result Date: 01/04/2022 CLINICAL DATA:  8 hour delay abdominal x-ray for small-bowel obstruction. EXAM: PORTABLE CHEST 1 VIEW COMPARISON:  None. FINDINGS: Cardiac silhouette and mediastinal contours are within normal limits. The lungs are clear. No pleural effusion or pneumothorax. No acute skeletal abnormality. Enteric tube descends below the diaphragm with the tip overlying the stomach in the left upper quadrant. IMPRESSION: No active pulmonary disease. Enteric tube descends below the diaphragm with the tip overlying the stomach in the left upper quadrant. Electronically Signed   By: Yvonne Kendall M.D.   On: 01/04/2022 14:15   DG Abd Portable 1V-Small Bowel Obstruction Protocol-24 hr delay  Result Date: 01/05/2022 CLINICAL DATA:  Follow-up small bowel obstruction. EXAM: PORTABLE ABDOMEN - 1 VIEW COMPARISON:  Most recent film yesterday at 1:54 p.m. FINDINGS: 4:59 a.m., 01/05/2022. Enteric tube terminates in the body of the stomach. No dilated  small bowel is seen on the current exam. This could indicate either decompression of the previously dilated small bowel or fluid filling of it. There is dense stool in the normal caliber colon, with aeration in the colon least as far as the distal descending colon. Visceral shadows are stable. IMPRESSION: Dilated small bowel is not seen on current exam. This could indicate fluid filling of dilated bowel or decompression. There is increasingly dense stool in the normal caliber colon. Electronically Signed   By: Almira Bar M.D.   On: 01/05/2022 07:08   DG Abd Portable 1V-Small Bowel Obstruction Protocol-initial, 8 hr delay  Result Date: 01/04/2022 CLINICAL DATA:  Follow-up small bowel obstruction EXAM: PORTABLE ABDOMEN - 1 VIEW COMPARISON:  07/04/2022 at 0342 hours FINDINGS: NG tube is not seen, and may have been removed or is located outside of the field of view. Mildly dilated small bowel within the left upper quadrant measuring up to 2.9 cm in diameter, improving from prior (previously measured 5.4 cm). Air is seen throughout the colon. Moderate colonic stool volume. No gross free intraperitoneal air. IMPRESSION: 1. Improving small bowel dilation suggesting resolving obstruction. 2. NG tube is not seen, and may have been removed or is located outside of the field of view. Correlate with exam. Electronically Signed   By: [name] D.O.   On: 01/04/2022 14:16   DG Abd Portable 1V-Small Bowel Protocol-Position Verification  Result Date: 01/04/2022 CLINICAL DATA:  21 year old female status post nasogastric tube placement. EXAM: PORTABLE ABDOMEN - 1 VIEW COMPARISON:  Abdominal radiograph 06/02/2021. FINDINGS: Tip of nasogastric tube is in the proximal stomach with side port at or immediately beyond the gastroesophageal junction. Several several dilated loops of small bowel are noted in the left upper quadrant of the abdomen measuring up to 5.4 cm in diameter. Gas and stool is noted throughout the colon  and rectum. Iodinated contrast material is noted within the collecting systems of both kidneys and in the ureters and urinary bladder, related to recent contrast enhanced CT examination. IMPRESSION: 1. Nasogastric tube is in the stomach but could be advanced approximately 10 cm for more optimal placement. 2. Persistent evidence of small bowel obstruction, as above. Electronically Signed   By: Trudie Reed M.D.   On: 01/04/2022 05:03     Medical Consultants:   None.   Subjective:    Lenaya Whitefield denies any shortness of breath and productive cough  Objective:    Vitals:   01/04/22 2009 01/05/22 0118 01/05/22 0610 01/05/22 0901  BP: 126/88 123/85 116/60 124/79  Pulse: (!) 104 88 63 100  Resp: 15 15 16 18   Temp: 98.1 F (36.7 C)  98.6 F (37 C)   TempSrc: Oral     SpO2: 100% 100% 100% 100%  Weight:      Height:       SpO2: 100 %   Intake/Output Summary (Last 24 hours) at 01/05/2022 0940 Last data filed at 01/05/2022 0900 Gross per 24 hour  Intake 3888.47 ml  Output 1225 ml  Net 2663.47 ml   Filed Weights   01/04/22 0005 01/04/22 0210  Weight: 75.3 kg 75.3 kg    Exam: General exam: In no acute distress. Respiratory system: Good air movement and clear to auscultation. Cardiovascular system: S1 & S2 heard, RRR.  No JVD. Gastrointestinal system: Abdomen is nondistended, soft and nontender.  Extremities: No pedal edema. Skin: No rashes, lesions or ulcers Psychiatry: Judgement and insight appear normal. Mood & affect appropriate.    Data Reviewed:    Labs: Basic Metabolic Panel: Recent Labs  Lab 01/04/22 0024 01/04/22 0350 01/05/22 0356  NA 136 135 140  K 3.7 4.2 3.5  CL 107 105 111  CO2 20* 21* 23  GLUCOSE 122* 108* 83  BUN 9 9 9   CREATININE 0.58 0.68 0.41*  CALCIUM 8.7* 8.9 8.3*  MG  --  1.8 2.0  PHOS  --   --  3.2   GFR Estimated Creatinine Clearance: 111.4 mL/min (A) (by C-G formula based on SCr of 0.41 mg/dL (L)). Liver Function Tests: Recent  Labs  Lab 01/04/22 0024 01/05/22 0356  AST 22 16  ALT 34 26  ALKPHOS 48 41  BILITOT 0.5 0.6  PROT 7.1 6.3*  ALBUMIN 4.3 3.6   Recent Labs  Lab 01/04/22 0024  LIPASE 36   No results for input(s): AMMONIA in the last 168 hours. Coagulation profile No results for input(s): INR, PROTIME in the last 168 hours. COVID-19 Labs  Recent Labs    01/04/22 1228 01/05/22 0356  DDIMER 0.36 <0.27  FERRITIN 40 48  CRP 4.7* 4.4*    Lab Results  Component Value Date   SARSCOV2NAA POSITIVE (A) 01/04/2022    CBC: Recent Labs  Lab 01/04/22 0024 01/04/22 0350 01/05/22 0356  WBC 15.1* 13.3* 5.9  NEUTROABS 11.5*  --  3.3  HGB 13.4 13.5 11.3*  HCT 40.4 40.9 36.4  MCV 81.5 81.0 85.4  PLT 310 296 229   Cardiac Enzymes: No results for input(s): CKTOTAL, CKMB, CKMBINDEX, TROPONINI in the last 168 hours. BNP (last 3 results) No results for input(s): PROBNP in the last 8760 hours. CBG: No results for input(s): GLUCAP in the last 168 hours. D-Dimer: Recent Labs    01/04/22 1228 01/05/22 0356  DDIMER 0.36 <0.27   Hgb A1c: No results for input(s): HGBA1C in the last 72 hours. Lipid Profile: No results for input(s): CHOL, HDL, LDLCALC, TRIG, CHOLHDL, LDLDIRECT in the last 72 hours. Thyroid function studies: No results for input(s): TSH, T4TOTAL, T3FREE, THYROIDAB in the last 72 hours.  Invalid input(s): FREET3 Anemia work up: Recent Labs    01/04/22 1228 01/05/22 0356  FERRITIN 40 48   Sepsis Labs: Recent Labs  Lab 01/04/22 0024 01/04/22 0350 01/04/22 1228 01/05/22 0356  PROCALCITON  --   --  <0.10  --   WBC 15.1* 13.3*  --  5.9   Microbiology Recent Results (from the past 240 hour(s))  Resp Panel by RT-PCR (Flu A&B, Covid) Nasopharyngeal Swab     Status: Abnormal   Collection Time: 01/04/22  3:50 AM   Specimen: Nasopharyngeal Swab; Nasopharyngeal(NP) swabs in vial transport medium  Result Value Ref Range Status   SARS Coronavirus 2 by RT PCR POSITIVE (A)  NEGATIVE Final    Comment: (NOTE) SARS-CoV-2 target nucleic acids are DETECTED.  The SARS-CoV-2 RNA is generally detectable in upper respiratory specimens during the acute phase of infection. Positive results are indicative of the presence of the identified virus, but do not rule out bacterial infection or co-infection with other pathogens not detected by the test. Clinical correlation with patient history and other diagnostic information is necessary to determine patient infection status. The expected result is Negative.  Fact Sheet for Patients: EntrepreneurPulse.com.au  Fact Sheet for Healthcare Providers: IncredibleEmployment.be  This  test is not yet approved or cleared by the [place] and  has been authorized for detection and/or diagnosis of SARS-CoV-2 by FDA under an Emergency Use Authorization (EUA).  This EUA will remain in effect (meaning this test can be used) for the duration of  the COVID-19 declaration under Section 564(b)(1) of the A ct, 21 U.S.C. section 360bbb-3(b)(1), unless the authorization is terminated or revoked sooner.     Influenza A by PCR NEGATIVE NEGATIVE Final   Influenza B by PCR NEGATIVE NEGATIVE Final    Comment: (NOTE) The Xpert Xpress SARS-CoV-2/FLU/RSV plus assay is intended as an aid in the diagnosis of influenza from Nasopharyngeal swab specimens and should not be used as a sole basis for treatment. Nasal washings and aspirates are unacceptable for Xpert Xpress SARS-CoV-2/FLU/RSV testing.  Fact Sheet for Patients: EntrepreneurPulse.com.au  Fact Sheet for Healthcare Providers: IncredibleEmployment.be  This test is not yet approved or cleared by the [place] FDA and has been authorized for detection and/or diagnosis of SARS-CoV-2 by FDA under an Emergency Use Authorization (EUA). This EUA will remain in effect (meaning this test can be used) for the  duration of the COVID-19 declaration under Section 564(b)(1) of the Act, 21 U.S.C. section 360bbb-3(b)(1), unless the authorization is terminated or revoked.  Performed at [facility], [place] [address], [place], [place] [address]      Medications:    enoxaparin (LOVENOX) injection  40 mg Subcutaneous Q24H   Continuous Infusions:  sodium chloride 125 mL/hr at 01/05/22 [id number]   methocarbamol (ROBAXIN) IV     potassium chloride 10 mEq (01/05/22 0936)   remdesivir 100 mg in NS 100 mL 100 mg (01/05/22 0932)      LOS: 0 days   Charlynne Cousins  [facility]  01/05/2022, 9:40 AM

## 2022-01-05 NOTE — Progress Notes (Signed)
Transition of Care [facility]) Screening Note  Patient Details  Name: Sabrina Pierce Date of Birth: [birth year]  Transition of Care [facility]) CM/SW Contact:    Ewing Schlein, LCSW Phone Number: 01/05/2022, 10:49 AM  Transition of Care Department [facility]) has reviewed patient and no TOC needs have been identified at this time. We will continue to monitor patient advancement through interdisciplinary progression rounds. If new patient transition needs arise, please place a TOC consult.

## 2022-01-05 NOTE — Progress Notes (Signed)
PT NGT has been clamped for 4 hrs. 50 cc's residual at four hour mark. Patient does not complain of nausea at this time. Will keep NGT clamped and monitor patient.

## 2022-01-06 LAB — CBC WITH DIFFERENTIAL/PLATELET
Abs Immature Granulocytes: 0.02 10*3/uL (ref 0.00–0.07)
Basophils Absolute: 0 10*3/uL (ref 0.0–0.1)
Basophils Relative: 0 %
Eosinophils Absolute: 0.2 10*3/uL (ref 0.0–0.5)
Eosinophils Relative: 2 %
HCT: 36.7 % (ref 36.0–46.0)
Hemoglobin: 11.8 g/dL — ABNORMAL LOW (ref 12.0–15.0)
Immature Granulocytes: 0 %
Lymphocytes Relative: 27 %
Lymphs Abs: 1.9 10*3/uL (ref 0.7–4.0)
MCH: 26.9 pg (ref 26.0–34.0)
MCHC: 32.2 g/dL (ref 30.0–36.0)
MCV: 83.8 fL (ref 80.0–100.0)
Monocytes Absolute: 0.7 10*3/uL (ref 0.1–1.0)
Monocytes Relative: 10 %
Neutro Abs: 4.2 10*3/uL (ref 1.7–7.7)
Neutrophils Relative %: 61 %
Platelets: 233 10*3/uL (ref 150–400)
RBC: 4.38 MIL/uL (ref 3.87–5.11)
RDW: 13.2 % (ref 11.5–15.5)
WBC: 7 10*3/uL (ref 4.0–10.5)
nRBC: 0 % (ref 0.0–0.2)

## 2022-01-06 LAB — COMPREHENSIVE METABOLIC PANEL
ALT: 24 U/L (ref 0–44)
AST: 13 U/L — ABNORMAL LOW (ref 15–41)
Albumin: 3.5 g/dL (ref 3.5–5.0)
Alkaline Phosphatase: 39 U/L (ref 38–126)
Anion gap: 7 (ref 5–15)
BUN: 5 mg/dL — ABNORMAL LOW (ref 6–20)
CO2: 21 mmol/L — ABNORMAL LOW (ref 22–32)
Calcium: 8.2 mg/dL — ABNORMAL LOW (ref 8.9–10.3)
Chloride: 107 mmol/L (ref 98–111)
Creatinine, Ser: 0.42 mg/dL — ABNORMAL LOW (ref 0.44–1.00)
GFR, Estimated: >60 mL/min (ref >60)
Glucose, Bld: 82 mg/dL (ref 70–99)
Potassium: 3.6 mmol/L (ref 3.5–5.1)
Sodium: 135 mmol/L (ref 135–145)
Total Bilirubin: 0.4 mg/dL (ref 0.3–1.2)
Total Protein: 6 g/dL — ABNORMAL LOW (ref 6.5–8.1)

## 2022-01-06 LAB — MAGNESIUM: Magnesium: 1.8 mg/dL (ref 1.7–2.4)

## 2022-01-06 LAB — D-DIMER, QUANTITATIVE: D-Dimer, Quant: <0.27 ug/mL-FEU (ref 0.00–0.50)

## 2022-01-06 LAB — FERRITIN: Ferritin: 54 ng/mL (ref 11–307)

## 2022-01-06 LAB — PHOSPHORUS: Phosphorus: 3.4 mg/dL (ref 2.5–4.6)

## 2022-01-06 LAB — C-REACTIVE PROTEIN: CRP: 2.8 mg/dL — ABNORMAL HIGH (ref <1.0)

## 2022-01-06 MED ORDER — POLYETHYLENE GLYCOL 3350 17 G PO PACK
17.0000 g | PACK | Freq: Two times a day (BID) | ORAL | Status: DC
  Administered 2022-01-06: 17 g via ORAL
  Filled 2022-01-06: qty 1

## 2022-01-06 MED ORDER — POLYETHYLENE GLYCOL 3350 17 G PO PACK: 17.0000 g | PACK | Freq: Two times a day (BID) | ORAL | 0 refills | Status: AC | PRN

## 2022-01-06 MED ORDER — POLYETHYLENE GLYCOL 3350 17 G PO PACK: 17.0000 g | PACK | Freq: Every day | ORAL | Status: DC

## 2022-01-06 MED ORDER — DOCUSATE SODIUM 100 MG PO CAPS
100.0000 mg | ORAL_CAPSULE | Freq: Two times a day (BID) | ORAL | Status: DC | PRN
  Administered 2022-01-06: 100 mg via ORAL
  Filled 2022-01-06: qty 1

## 2022-01-06 NOTE — Progress Notes (Signed)
[facility] PROGRESS NOTE    Progress Note  Sabrina Pierce  [id number] DOB: 2001-11-23 DOA: 01/04/2022 PCP: Pcp, No     Brief Narrative:   Sabrina Pierce is an 21 y.o. female past medical history of abdominal pain constipation seasonal allergies for small bowel obstruction we are consulted for upper respiratory symptoms was found to have incidental COVID-19 SARS-CoV-2 PCR positive      Assessment/Plan:   Small bowel obstruction ([place]) Per surgery.  Acute respiratory disease due to COVID-19 virus Has defervesced Tmax of 98.6, saturating 100% on room air. Discontinue remdesivir. She is completely asymptomatic. We will go ahead and sign off.     DVT prophylaxis: lovenox Family Communication:none         Code Status:     Code Status Orders  (From admission, onward)           Start     Ordered   01/04/22 0322  Full code  Continuous        01/04/22 0323           Code Status History     This patient has a current code status but no historical code status.         IV Access:   Peripheral IV   Procedures and diagnostic studies:   DG CHEST PORT 1 VIEW  Result Date: 01/04/2022 CLINICAL DATA:  8 hour delay abdominal x-ray for small-bowel obstruction. EXAM: PORTABLE CHEST 1 VIEW COMPARISON:  None. FINDINGS: Cardiac silhouette and mediastinal contours are within normal limits. The lungs are clear. No pleural effusion or pneumothorax. No acute skeletal abnormality. Enteric tube descends below the diaphragm with the tip overlying the stomach in the left upper quadrant. IMPRESSION: No active pulmonary disease. Enteric tube descends below the diaphragm with the tip overlying the stomach in the left upper quadrant. Electronically Signed   By: Yvonne Kendall M.D.   On: 01/04/2022 14:15   DG Abd Portable 1V-Small Bowel Obstruction Protocol-24 hr delay  Result Date: 01/05/2022 CLINICAL DATA:  Follow-up small bowel obstruction. EXAM: PORTABLE ABDOMEN - 1  VIEW COMPARISON:  Most recent film yesterday at 1:54 p.m. FINDINGS: 4:59 a.m., 01/05/2022. Enteric tube terminates in the body of the stomach. No dilated small bowel is seen on the current exam. This could indicate either decompression of the previously dilated small bowel or fluid filling of it. There is dense stool in the normal caliber colon, with aeration in the colon least as far as the distal descending colon. Visceral shadows are stable. IMPRESSION: Dilated small bowel is not seen on current exam. This could indicate fluid filling of dilated bowel or decompression. There is increasingly dense stool in the normal caliber colon. Electronically Signed   By: Telford Nab M.D.   On: 01/05/2022 07:08   DG Abd Portable 1V-Small Bowel Obstruction Protocol-initial, 8 hr delay  Result Date: 01/04/2022 CLINICAL DATA:  Follow-up small bowel obstruction EXAM: PORTABLE ABDOMEN - 1 VIEW COMPARISON:  07/04/2022 at 0342 hours FINDINGS: NG tube is not seen, and may have been removed or is located outside of the field of view. Mildly dilated small bowel within the left upper quadrant measuring up to 2.9 cm in diameter, improving from prior (previously measured 5.4 cm). Air is seen throughout the colon. Moderate colonic stool volume. No gross free intraperitoneal air. IMPRESSION: 1. Improving small bowel dilation suggesting resolving obstruction. 2. NG tube is not seen, and may have been removed or is located outside of the field of view. Correlate with  exam. Electronically Signed   By: Davina Poke D.O.   On: 01/04/2022 14:16     Medical Consultants:   None.   Subjective:    Sabrina Pierce no complaints this morning  Objective:    Vitals:   01/05/22 0901 01/05/22 1310 01/05/22 2200 01/06/22 0658  BP: 124/79 110/74 114/75 95/64  Pulse: 100 (!) 101 100 84  Resp: 18 18 16 17   Temp:  99.3 F (37.4 C) 99.5 F (37.5 C) 98.4 F (36.9 C)  TempSrc:  Oral Oral   SpO2: 100% 100% 100% 100%  Weight:       Height:       SpO2: 100 %   Intake/Output Summary (Last 24 hours) at 01/06/2022 0826 Last data filed at 01/05/2022 2130 Gross per 24 hour  Intake 3672.5 ml  Output --  Net 3672.5 ml    Filed Weights   01/04/22 0005 01/04/22 0210  Weight: 75.3 kg 75.3 kg    Exam: General exam: In no acute distress. Respiratory system: Good air movement and clear to auscultation. Cardiovascular system: S1 & S2 heard, RRR. No JVD. Gastrointestinal system: Abdomen is nondistended, soft and nontender.  Extremities: No pedal edema. Skin: No rashes, lesions or ulcers Psychiatry: Judgement and insight appear normal. Mood & affect appropriate.   Data Reviewed:    Labs: Basic Metabolic Panel: Recent Labs  Lab 01/04/22 0024 01/04/22 0350 01/05/22 0356 01/06/22 0415  NA 136 135 140 135  K 3.7 4.2 3.5 3.6  CL 107 105 111 107  CO2 20* 21* 23 21*  GLUCOSE 122* 108* 83 82  BUN 9 9 9  5*  CREATININE 0.58 0.68 0.41* 0.42*  CALCIUM 8.7* 8.9 8.3* 8.2*  MG  --  1.8 2.0 1.8  PHOS  --   --  3.2 3.4    GFR Estimated Creatinine Clearance: 111.4 mL/min (A) (by C-G formula based on SCr of 0.42 mg/dL (L)). Liver Function Tests: Recent Labs  Lab 01/04/22 0024 01/05/22 0356 01/06/22 0415  AST 22 16 13*  ALT 34 26 24  ALKPHOS 48 41 39  BILITOT 0.5 0.6 0.4  PROT 7.1 6.3* 6.0*  ALBUMIN 4.3 3.6 3.5    Recent Labs  Lab 01/04/22 0024  LIPASE 36    No results for input(s): AMMONIA in the last 168 hours. Coagulation profile No results for input(s): INR, PROTIME in the last 168 hours. COVID-19 Labs  Recent Labs    01/04/22 1228 01/05/22 0356 01/06/22 0415  DDIMER 0.36 <0.27 <0.27  FERRITIN 40 48 54  CRP 4.7* 4.4* 2.8*     Lab Results  Component Value Date   SARSCOV2NAA POSITIVE (A) 01/04/2022    CBC: Recent Labs  Lab 01/04/22 0024 01/04/22 0350 01/05/22 0356 01/06/22 0415  WBC 15.1* 13.3* 5.9 7.0  NEUTROABS 11.5*  --  3.3 4.2  HGB 13.4 13.5 11.3* 11.8*  HCT 40.4 40.9  36.4 36.7  MCV 81.5 81.0 85.4 83.8  PLT 310 296 229 233    Cardiac Enzymes: No results for input(s): CKTOTAL, CKMB, CKMBINDEX, TROPONINI in the last 168 hours. BNP (last 3 results) No results for input(s): PROBNP in the last 8760 hours. CBG: No results for input(s): GLUCAP in the last 168 hours. D-Dimer: Recent Labs    01/05/22 0356 01/06/22 0415  DDIMER <0.27 <0.27    Hgb A1c: No results for input(s): HGBA1C in the last 72 hours. Lipid Profile: No results for input(s): CHOL, HDL, LDLCALC, TRIG, CHOLHDL, LDLDIRECT in the last 72 hours.  Thyroid function studies: No results for input(s): TSH, T4TOTAL, T3FREE, THYROIDAB in the last 72 hours.  Invalid input(s): FREET3 Anemia work up: Recent Labs    01/05/22 0356 01/06/22 0415  FERRITIN 48 54    Sepsis Labs: Recent Labs  Lab 01/04/22 0024 01/04/22 0350 01/04/22 1228 01/05/22 0356 01/06/22 0415  PROCALCITON  --   --  <0.10  --   --   WBC 15.1* 13.3*  --  5.9 7.0    Microbiology Recent Results (from the past 240 hour(s))  Resp Panel by RT-PCR (Flu A&B, Covid) Nasopharyngeal Swab     Status: Abnormal   Collection Time: 01/04/22  3:50 AM   Specimen: Nasopharyngeal Swab; Nasopharyngeal(NP) swabs in vial transport medium  Result Value Ref Range Status   SARS Coronavirus 2 by RT PCR POSITIVE (A) NEGATIVE Final    Comment: (NOTE) SARS-CoV-2 target nucleic acids are DETECTED.  The SARS-CoV-2 RNA is generally detectable in upper respiratory specimens during the acute phase of infection. Positive results are indicative of the presence of the identified virus, but do not rule out bacterial infection or co-infection with other pathogens not detected by the test. Clinical correlation with patient history and other diagnostic information is necessary to determine patient infection status. The expected result is Negative.  Fact Sheet for Patients: EntrepreneurPulse.com.au  Fact Sheet for Healthcare  Providers: IncredibleEmployment.be  This test is not yet approved or cleared by the [place] FDA and  has been authorized for detection and/or diagnosis of SARS-CoV-2 by FDA under an Emergency Use Authorization (EUA).  This EUA will remain in effect (meaning this test can be used) for the duration of  the COVID-19 declaration under Section 564(b)(1) of the A ct, 21 U.S.C. section 360bbb-3(b)(1), unless the authorization is terminated or revoked sooner.     Influenza A by PCR NEGATIVE NEGATIVE Final   Influenza B by PCR NEGATIVE NEGATIVE Final    Comment: (NOTE) The Xpert Xpress SARS-CoV-2/FLU/RSV plus assay is intended as an aid in the diagnosis of influenza from Nasopharyngeal swab specimens and should not be used as a sole basis for treatment. Nasal washings and aspirates are unacceptable for Xpert Xpress SARS-CoV-2/FLU/RSV testing.  Fact Sheet for Patients: EntrepreneurPulse.com.au  Fact Sheet for Healthcare Providers: IncredibleEmployment.be  This test is not yet approved or cleared by the [place] FDA and has been authorized for detection and/or diagnosis of SARS-CoV-2 by FDA under an Emergency Use Authorization (EUA). This EUA will remain in effect (meaning this test can be used) for the duration of the COVID-19 declaration under Section 564(b)(1) of the Act, 21 U.S.C. section 360bbb-3(b)(1), unless the authorization is terminated or revoked.  Performed at [facility], [place] [address] [place], [place] [address]      Medications:    enoxaparin (LOVENOX) injection  40 mg Subcutaneous Q24H   polyethylene glycol  17 g Oral BID   Continuous Infusions:  sodium chloride 125 mL/hr at 01/05/22 2240   methocarbamol (ROBAXIN) IV        LOS: 1 day   Charlynne Cousins  [facility]  01/06/2022, 8:26 AM

## 2022-01-06 NOTE — Discharge Summary (Signed)
° ° °  Patient ID: Sabrina Pierce [id number] [birth year] 21 y.o.  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  She does have previous bowel obstructions that did not require surgery- this is her third in about a year.  Procedures None  Hospital Course:  This is a 21 y.o. female with a history of gastroschisis repair and laparoscopic cholecystectomy with 5 hour lysis of adhesions at age 16, who presented with 5d hx of abdominal pain and 1 day hx of emesis. Patient found to have sbo on imaging. NGT was placed and sbo protocol was initiated. Patient symptoms resolved and she had robf. NGT was removed and diet was advanced and tolerated without associated abdominal pain, n/v. She was started on Miralax BID and is to continue at discharge. Family requesting referral to [facility] to discuss possible elective LOA. I have reached out to our office to place referral. On 2/10 patient was tolerating soft diet without any abdominal pain, n/v, passing flatus, having bowel function, NT on exam and felt safe for discharge home. Diet recommendations discussed. Return precautions discussed.   Patient found to have covid on admission. Patient was febrile on HD1 and complained of some sob. CXR negative. TRH was consulted and placed on Remdesivir. Symptoms resolved and Remdesivir was d/c'd. Appreciate TRH's assistance with her  care.   Physical Exam: Gen:  Alert, NAD, pleasant Pulm: normal rate and effort  Abd: Soft, ND, NT +BS   Allergies as of 01/06/2022   No Known Allergies      Medication List     STOP taking these medications    Fiber Select Gummies Chew       TAKE these medications    polyethylene glycol 17 g packet Commonly known as: MIRALAX / GLYCOLAX Take 17 g by mouth 2 (two) times daily as needed.          Follow-up Information     [facility] Follow up.   Specialty: General Surgery Why: As needed. I have asked the office to send a referral to [facility] as you requested. Contact information: [address] [place] [address] [phone]                Signed: Leary Roca, [facility] 01/06/2022, 3:49 PM Please see Amion for pager number during day hours 7:00am-4:30pm

## 2022-01-06 NOTE — Progress Notes (Signed)
Subjective: CC: Doing great. NGT out yesterday. Tolerating cld. Reports she had 7 apple juices last night. No n/v. Denies any abdominal pain. No PRN antinausea or pain medication yesterday. Continues to pass flatus. Fleet enema around 930 yesterday morning. She had some hard stools pass after this. Later in the day she had 2 episodes of loose/watery stools with the last around 10pm. Mobilizing in the room. No sob. No other complaints.   Objective: Vital signs in last 24 hours: Temp:  [98.4 F (36.9 C)-99.5 F (37.5 C)] 98.4 F (36.9 C) (02/10 0658) Pulse Rate:  [84-101] 84 (02/10 0658) Resp:  [16-18] 17 (02/10 0658) BP: (95-124)/(64-79) 95/64 (02/10 0658) SpO2:  [100 %] 100 % (02/10 0658) Last BM Date: 01/05/22  Intake/Output from previous day: 02/09 0701 - 02/10 0700 In: 3672.5 [P.O.:1660; I.V.:1512.5; IV Piggyback:500] Out: -  Intake/Output this shift: No intake/output data recorded.  PE: Gen:  Alert, NAD, pleasant Card:  RRR Pulm:  CTAB, no W/R/R, effort normal Abd: Soft, ND, NT +BS Ext:  No LE edema  Psych: A&Ox3  Skin: no rashes noted, warm and dry  Lab Results:  Recent Labs    01/05/22 0356 01/06/22 0415  WBC 5.9 7.0  HGB 11.3* 11.8*  HCT 36.4 36.7  PLT 229 233   BMET Recent Labs    01/05/22 0356 01/06/22 0415  NA 140 135  K 3.5 3.6  CL 111 107  CO2 23 21*  GLUCOSE 83 82  BUN 9 5*  CREATININE 0.41* 0.42*  CALCIUM 8.3* 8.2*   PT/INR No results for input(s): LABPROT, INR in the last 72 hours. CMP     Component Value Date/Time   NA 135 01/06/2022 0415   K 3.6 01/06/2022 0415   CL 107 01/06/2022 0415   CO2 21 (L) 01/06/2022 0415   GLUCOSE 82 01/06/2022 0415   BUN 5 (L) 01/06/2022 0415   CREATININE 0.42 (L) 01/06/2022 0415   CALCIUM 8.2 (L) 01/06/2022 0415   PROT 6.0 (L) 01/06/2022 0415   ALBUMIN 3.5 01/06/2022 0415   AST 13 (L) 01/06/2022 0415   ALT 24 01/06/2022 0415   ALKPHOS 39 01/06/2022 0415   BILITOT 0.4 01/06/2022 0415    GFRNONAA >60 01/06/2022 0415   GFRAA NOT CALCULATED 01/14/2013 1248   Lipase     Component Value Date/Time   LIPASE 36 01/04/2022 0024    Studies/Results: DG CHEST PORT 1 VIEW  Result Date: 01/04/2022 CLINICAL DATA:  8 hour delay abdominal x-ray for small-bowel obstruction. EXAM: PORTABLE CHEST 1 VIEW COMPARISON:  None. FINDINGS: Cardiac silhouette and mediastinal contours are within normal limits. The lungs are clear. No pleural effusion or pneumothorax. No acute skeletal abnormality. Enteric tube descends below the diaphragm with the tip overlying the stomach in the left upper quadrant. IMPRESSION: No active pulmonary disease. Enteric tube descends below the diaphragm with the tip overlying the stomach in the left upper quadrant. Electronically Signed   By: Neita Garnet M.D.   On: 01/04/2022 14:15   DG Abd Portable 1V-Small Bowel Obstruction Protocol-24 hr delay  Result Date: 01/05/2022 CLINICAL DATA:  Follow-up small bowel obstruction. EXAM: PORTABLE ABDOMEN - 1 VIEW COMPARISON:  Most recent film yesterday at 1:54 p.m. FINDINGS: 4:59 a.m., 01/05/2022. Enteric tube terminates in the body of the stomach. No dilated small bowel is seen on the current exam. This could indicate either decompression of the previously dilated small bowel or fluid filling of it. There is dense stool in  the normal caliber colon, with aeration in the colon least as far as the distal descending colon. Visceral shadows are stable. IMPRESSION: Dilated small bowel is not seen on current exam. This could indicate fluid filling of dilated bowel or decompression. There is increasingly dense stool in the normal caliber colon. Electronically Signed   By: Almira Bar M.D.   On: 01/05/2022 07:08   DG Abd Portable 1V-Small Bowel Obstruction Protocol-initial, 8 hr delay  Result Date: 01/04/2022 CLINICAL DATA:  Follow-up small bowel obstruction EXAM: PORTABLE ABDOMEN - 1 VIEW COMPARISON:  07/04/2022 at 0342 hours FINDINGS: NG tube  is not seen, and may have been removed or is located outside of the field of view. Mildly dilated small bowel within the left upper quadrant measuring up to 2.9 cm in diameter, improving from prior (previously measured 5.4 cm). Air is seen throughout the colon. Moderate colonic stool volume. No gross free intraperitoneal air. IMPRESSION: 1. Improving small bowel dilation suggesting resolving obstruction. 2. NG tube is not seen, and may have been removed or is located outside of the field of view. Correlate with exam. Electronically Signed   By: [name] D.O.   On: 01/04/2022 14:16    Anti-infectives: Anti-infectives (From admission, onward)    Start     Dose/Rate Route Frequency Ordered Stop   01/05/22 1000  remdesivir 100 mg in sodium chloride 0.9 % 100 mL IVPB  Status:  Discontinued       See Hyperspace for full Linked Orders Report.   100 mg 200 mL/hr over 30 Minutes Intravenous Daily 01/04/22 1228 01/06/22 0826   01/04/22 1400  remdesivir 200 mg in sodium chloride 0.9% 250 mL IVPB       See Hyperspace for full Linked Orders Report.   200 mg 580 mL/hr over 30 Minutes Intravenous Once 01/04/22 1228 01/04/22 1415        Assessment/Plan SBO - No indication for emergency surgery at this time - Clinically improving. Patient tolerating cld without any abdominal pain, distension, n/v. She has ROBF. Exam reassuring. Advance to FLD this morning. If tolerates, will advance to soft diet with possible d/c home this PM.    FEN: FLD > ADAT to Reg VTE: SCDs, Lovenox  ID: Remdesivir d/c'd by [facility]. WBC wnl.  Foley: None    COVID + - TRH was consulted. Placed on Remdesivir, now d/c'd by their team and they have s/o   This care required straightforward level of medical decision making.    LOS: 1 day    Sabrina Pierce , [facility] 01/06/2022, 8:52 AM Please see Amion for pager number during day hours 7:00am-4:30pm

## 2022-02-08 IMAGING — CT CT ABD-PELV W/ CM
2 of 4 series · 16 of 46 positions shown, 18 images · IV contrast (OMNIPAQUE 350)
Comparison: 01/14/2013

CLINICAL DATA: Abdominal pain, bloating, nausea. Bowel obstruction
suspected bloating, history of SBO

EXAM:
CT ABDOMEN AND PELVIS WITH CONTRAST
TECHNIQUE: Multidetector CT imaging of the abdomen and pelvis was performed
using the standard protocol following bolus administration of
intravenous contrast.

[Series 2: axial st · axial · 0.68mm/px · z∈[+1256,+1661]mm · 13 of 91 slices shown, 15 images]
[im 5/91  soft-tissue]
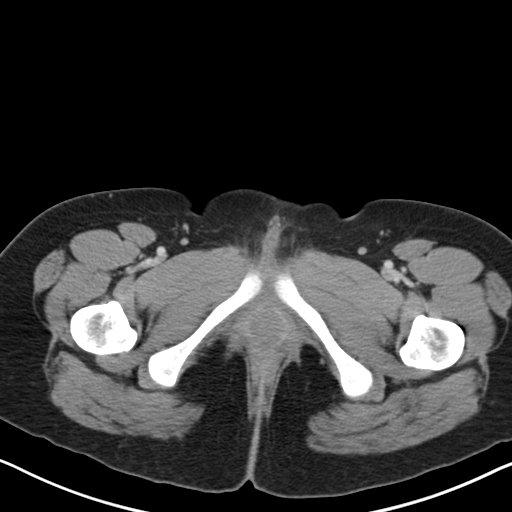
[im 5/91  bone]
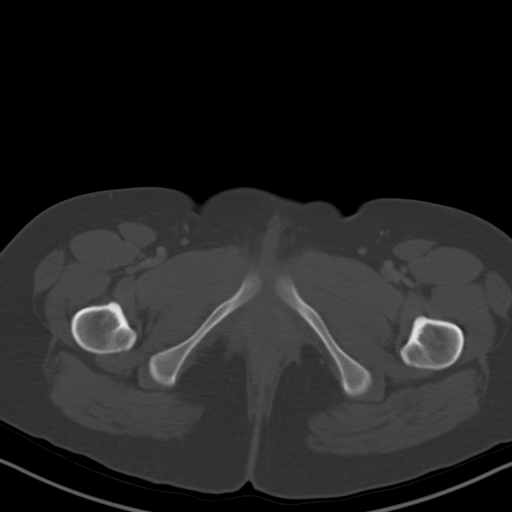
[im 13/91  soft-tissue]
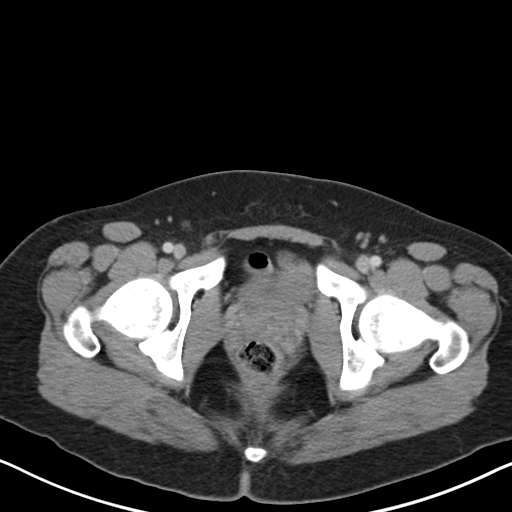
[im 21/91  soft-tissue]
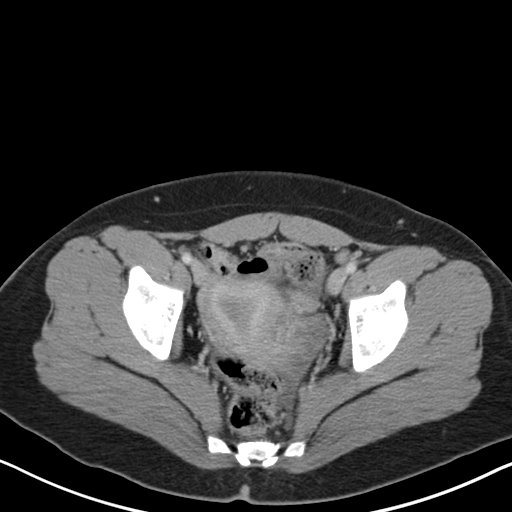
[im 25/91  soft-tissue]
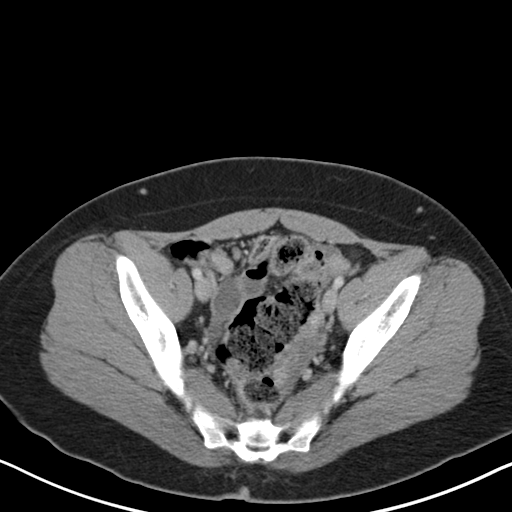
[im 33/91  soft-tissue]
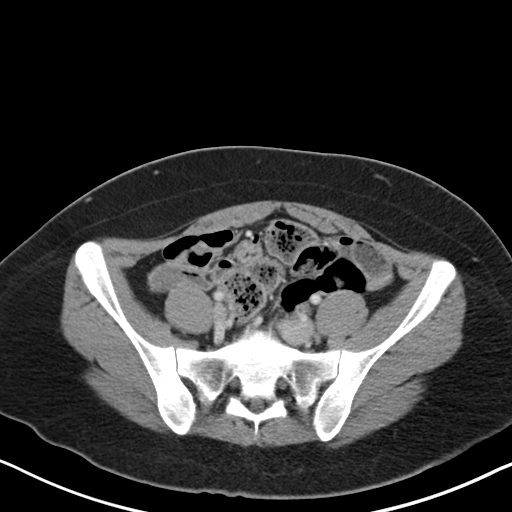
[im 37/91  soft-tissue]
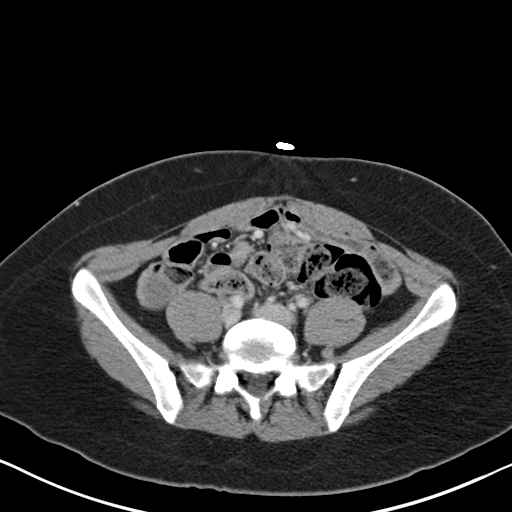
[im 46/91  soft-tissue]
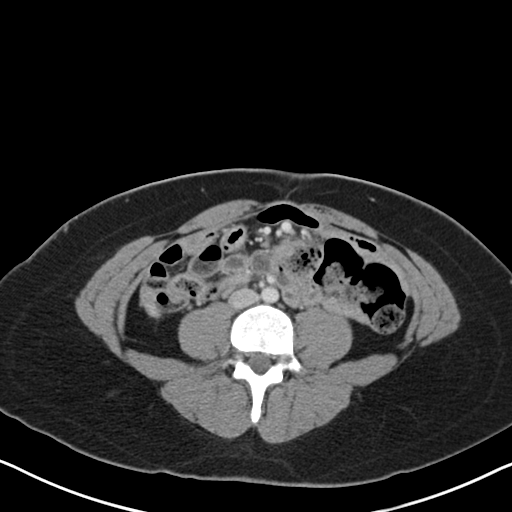
[im 54/91  soft-tissue]
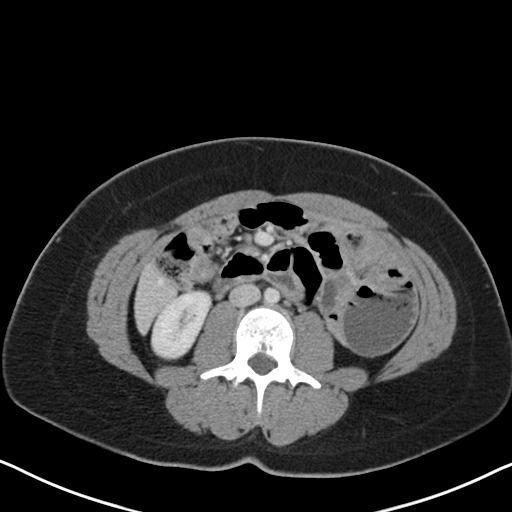
[im 58/91  soft-tissue]
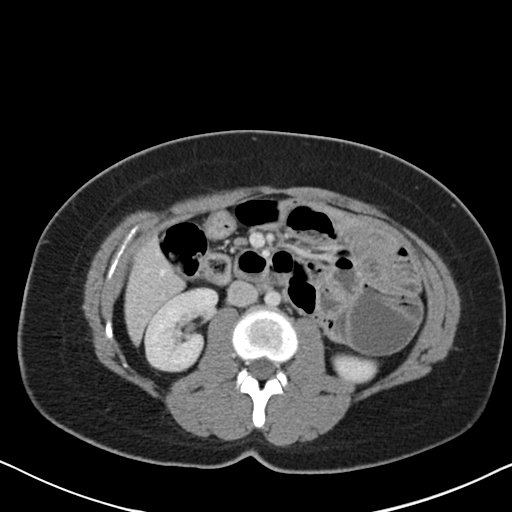
[im 58/91  bone]
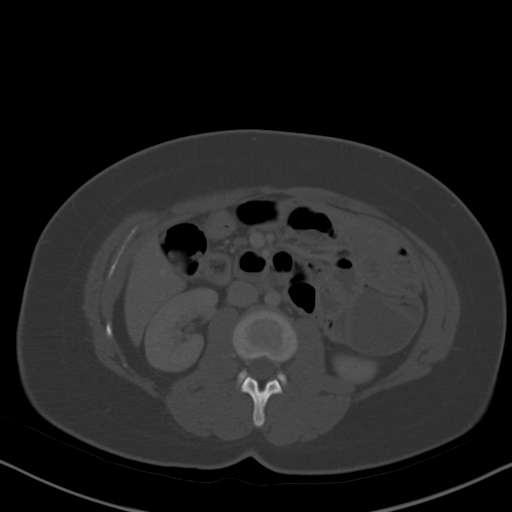
[im 66/91  soft-tissue]
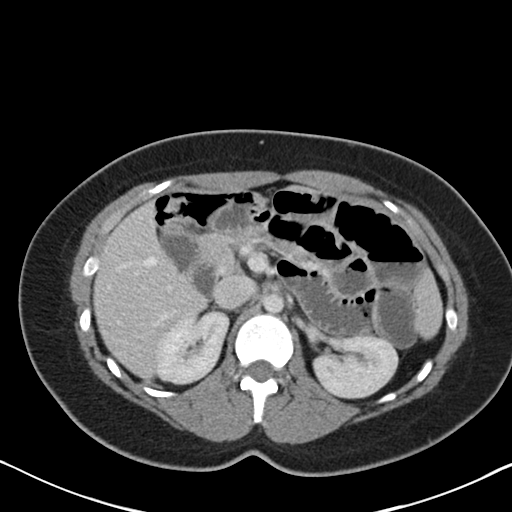
[im 70/91  soft-tissue]
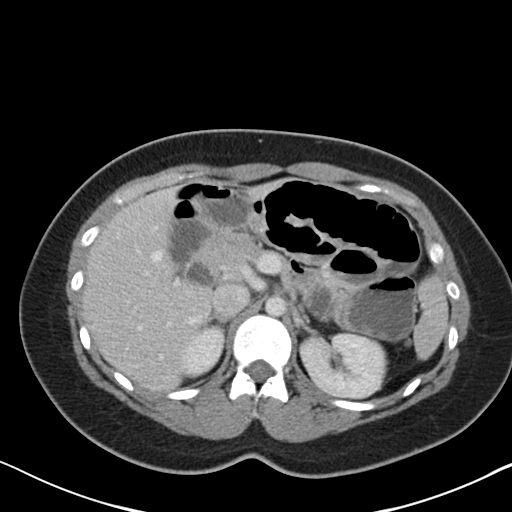
[im 78/91  soft-tissue]
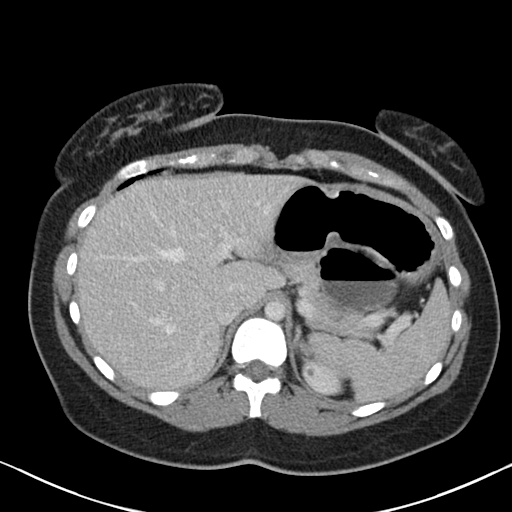
[im 86/91  soft-tissue]
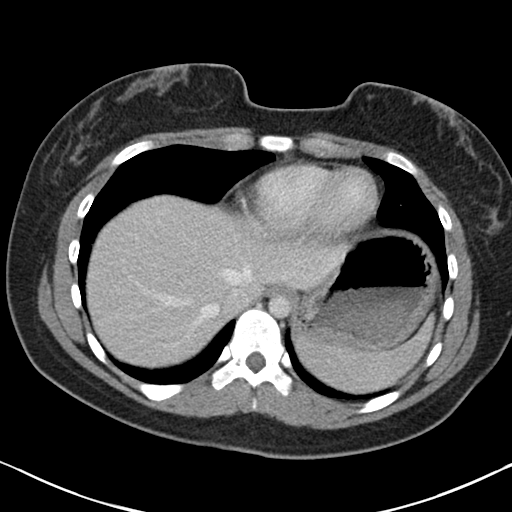

[Series 4: coronal st · coronal · 0.66mm/px · 3 of 138 slices shown]
[im 46/138  soft-tissue]
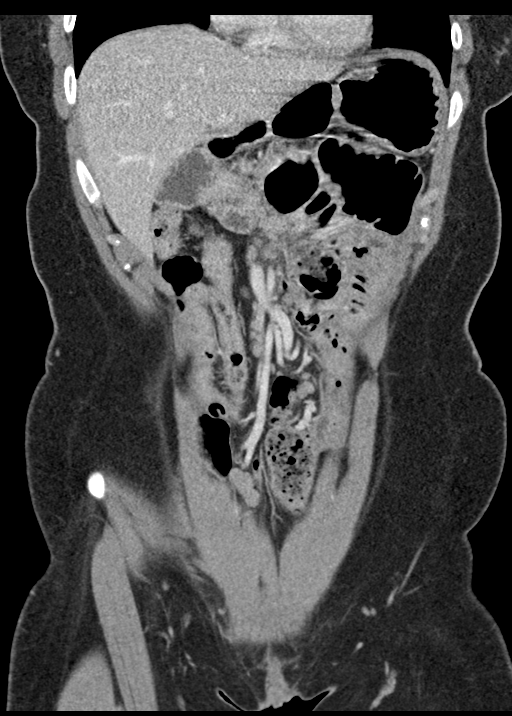
[im 61/138  soft-tissue]
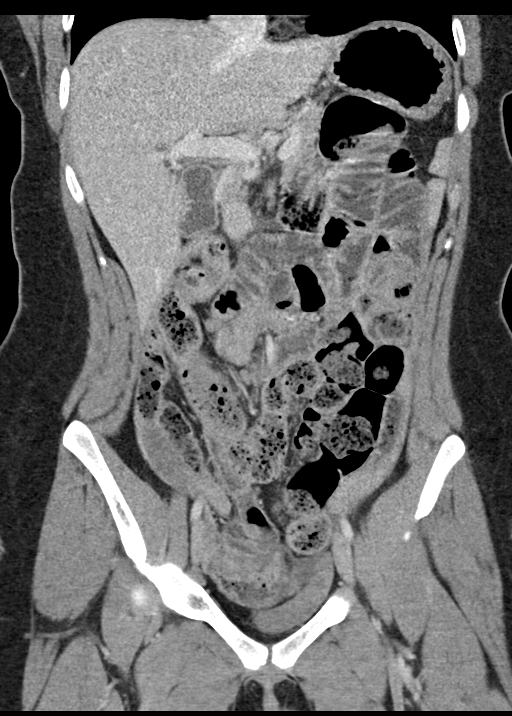
[im 77/138  soft-tissue]
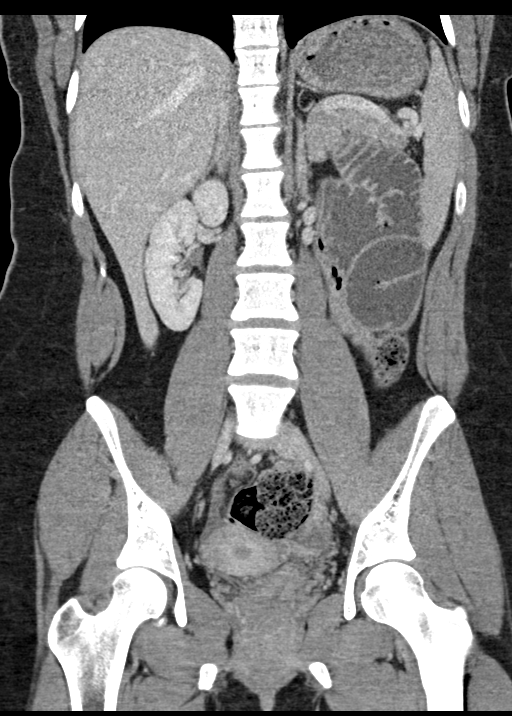

[16 of 46 positions shown; findings below may reference images not displayed]

RADIATION DOSE REDUCTION: This exam was performed according to the
departmental dose-optimization program which includes automated
exposure control, adjustment of the mA and/or kV according to
patient size and/or use of iterative reconstruction technique.

CONTRAST:  80mL OMNIPAQUE IOHEXOL 350 MG/ML SOLN
FINDINGS: Lower chest: No acute abnormality.

Hepatobiliary: No focal hepatic abnormality. Gallbladder
unremarkable.

Pancreas: No focal abnormality or ductal dilatation.

Spleen: No focal abnormality.  Normal size.

Adrenals/Urinary Tract: No adrenal abnormality. No focal renal
abnormality. No stones or hydronephrosis. Urinary bladder is
unremarkable.

Stomach/Bowel: Large stool burden in the colon, particularly left
colon. Mildly dilated upper and mid abdominal small bowel loops.
Distal small bowel loops decompressed. Findings concerning for
partial small bowel obstruction.

Vascular/Lymphatic: No evidence of aneurysm or adenopathy.

Reproductive: Uterus and adnexa unremarkable.  No mass.

Other: Small amount of free fluid in the pelvis.  No free air.

Musculoskeletal: No acute bony abnormality.
IMPRESSION: Dilated proximal and mid small bowel loops with air-fluid levels
concerning for partial small bowel obstruction.

Large stool burden throughout the colon.

## 2022-02-09 IMAGING — DX DG ABD PORTABLE 1V
1 series · 1 of 1 positions shown · non-contrast
Comparison: Most recent film yesterday at [DATE] p.m.

CLINICAL DATA: Follow-up small bowel obstruction.

EXAM:
PORTABLE ABDOMEN - 1 VIEW

[abdomen kub]
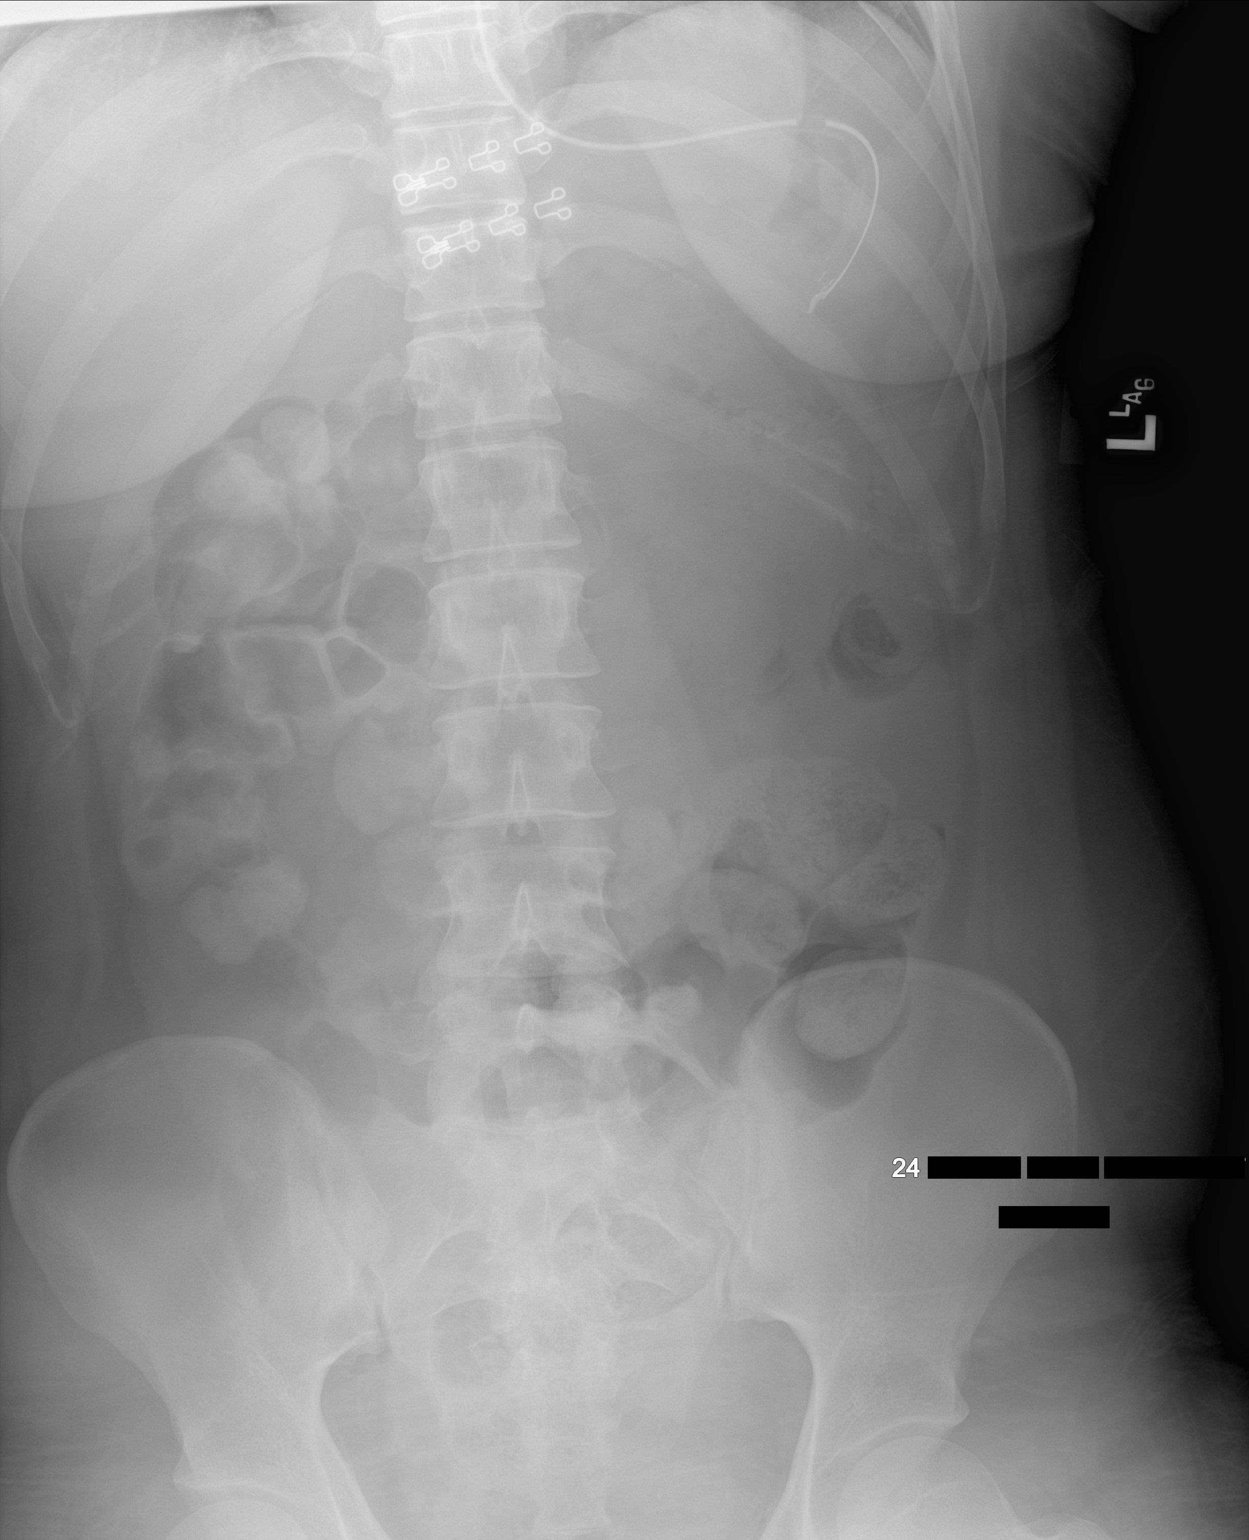

[1 of 1 positions shown; findings below may reference images not displayed]

FINDINGS: [DATE] a.m., 01/05/2022. Enteric tube terminates in the body of the
stomach. No dilated small bowel is seen on the current exam. This
could indicate either decompression of the previously dilated small
bowel or fluid filling of it.

There is dense stool in the normal caliber colon, with aeration in
the colon least as far as the distal descending colon. Visceral
shadows are stable.
IMPRESSION: Dilated small bowel is not seen on current exam. This could indicate
fluid filling of dilated bowel or decompression. There is
increasingly dense stool in the normal caliber colon.

## 2022-04-19 ENCOUNTER — Encounter: Payer: Self-pay | Admitting: Emergency Medicine

## 2022-04-19 ENCOUNTER — Ambulatory Visit (INDEPENDENT_AMBULATORY_CARE_PROVIDER_SITE_OTHER): Payer: Managed Care, Other (non HMO) | Admitting: Emergency Medicine

## 2022-04-19 VITALS — BP 112/68 | HR 88 | Temp 98.0°F | Ht 64.0 in | Wt 162.4 lb

## 2022-04-19 DIAGNOSIS — Z1159 Encounter for screening for other viral diseases: Secondary | ICD-10-CM

## 2022-04-19 DIAGNOSIS — Z8719 Personal history of other diseases of the digestive system: Secondary | ICD-10-CM | POA: Diagnosis not present

## 2022-04-19 DIAGNOSIS — Z130 Encounter for screening for diseases of the blood and blood-forming organs and certain disorders involving the immune mechanism: Secondary | ICD-10-CM | POA: Diagnosis not present

## 2022-04-19 DIAGNOSIS — Z13220 Encounter for screening for lipoid disorders: Secondary | ICD-10-CM

## 2022-04-19 DIAGNOSIS — Q793 Gastroschisis: Secondary | ICD-10-CM

## 2022-04-19 DIAGNOSIS — Z7689 Persons encountering health services in other specified circumstances: Secondary | ICD-10-CM

## 2022-04-19 DIAGNOSIS — Z13228 Encounter for screening for other metabolic disorders: Secondary | ICD-10-CM | POA: Diagnosis not present

## 2022-04-19 DIAGNOSIS — Z0000 Encounter for general adult medical examination without abnormal findings: Secondary | ICD-10-CM | POA: Diagnosis not present

## 2022-04-19 DIAGNOSIS — Z1329 Encounter for screening for other suspected endocrine disorder: Secondary | ICD-10-CM | POA: Diagnosis not present

## 2022-04-19 LAB — LIPID PANEL
Cholesterol: 140 mg/dL (ref 0–200)
HDL: 33 mg/dL — ABNORMAL LOW (ref >39.00)
LDL Cholesterol: 79 mg/dL (ref 0–99)
NonHDL: 106.88
Total CHOL/HDL Ratio: 4
Triglycerides: 137 mg/dL (ref 0.0–149.0)
VLDL: 27.4 mg/dL (ref 0.0–40.0)

## 2022-04-19 LAB — COMPREHENSIVE METABOLIC PANEL
ALT: 21 U/L (ref 0–35)
AST: 13 U/L (ref 0–37)
Albumin: 5 g/dL (ref 3.5–5.2)
Alkaline Phosphatase: 64 U/L (ref 39–117)
BUN: 12 mg/dL (ref 6–23)
CO2: 25 mEq/L (ref 19–32)
Calcium: 9.6 mg/dL (ref 8.4–10.5)
Chloride: 103 mEq/L (ref 96–112)
Creatinine, Ser: 0.79 mg/dL (ref 0.40–1.20)
GFR: 107.78 mL/min (ref >60.00)
Glucose, Bld: 87 mg/dL (ref 70–99)
Potassium: 3.9 mEq/L (ref 3.5–5.1)
Sodium: 138 mEq/L (ref 135–145)
Total Bilirubin: 0.3 mg/dL (ref 0.2–1.2)
Total Protein: 7.9 g/dL (ref 6.0–8.3)

## 2022-04-19 LAB — CBC WITH DIFFERENTIAL/PLATELET
Basophils Absolute: 0 10*3/uL (ref 0.0–0.1)
Basophils Relative: 0.2 % (ref 0.0–3.0)
Eosinophils Absolute: 0.1 10*3/uL (ref 0.0–0.7)
Eosinophils Relative: 1.7 % (ref 0.0–5.0)
HCT: 40 % (ref 36.0–46.0)
Hemoglobin: 13.4 g/dL (ref 12.0–15.0)
Lymphocytes Relative: 37.5 % (ref 12.0–46.0)
Lymphs Abs: 3.1 10*3/uL (ref 0.7–4.0)
MCHC: 33.4 g/dL (ref 30.0–36.0)
MCV: 80.2 fl (ref 78.0–100.0)
Monocytes Absolute: 0.6 10*3/uL (ref 0.1–1.0)
Monocytes Relative: 6.8 % (ref 3.0–12.0)
Neutro Abs: 4.4 10*3/uL (ref 1.4–7.7)
Neutrophils Relative %: 53.8 % (ref 43.0–77.0)
Platelets: 364 10*3/uL (ref 150.0–400.0)
RBC: 4.99 Mil/uL (ref 3.87–5.11)
RDW: 13.5 % (ref 11.5–14.6)
WBC: 8.2 10*3/uL (ref 4.5–10.5)

## 2022-04-19 LAB — HEMOGLOBIN A1C: Hgb A1c MFr Bld: 5.4 % (ref 4.6–6.5)

## 2022-04-19 NOTE — Patient Instructions (Signed)

## 2022-04-19 NOTE — Progress Notes (Signed)
Sabrina Pierce 21 y.o.   Chief Complaint  Patient presents with   New Patient (Initial Visit)    No concerns    HISTORY OF PRESENT ILLNESS: This is a 21 y.o. female first visit to this office, here to establish care with me. Has no complaints or medical concerns today. However patient has a history of recurrent bowel obstructions and has had several abdominal surgeries in the past. Needs a gastrointestinal MD local referral.  HPI   Prior to Admission medications   Medication Sig Start Date End Date Taking? Authorizing Provider  polyethylene glycol (MIRALAX / GLYCOLAX) 17 g packet Take 17 g by mouth 2 (two) times daily as needed. 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   Chronic constipation     Past Medical History:  Diagnosis Date   Abdominal pain    Constipation    Seasonal allergies     Past Surgical History:  Procedure Laterality Date   GASTROSCHISIS CLOSURE      Social History   Socioeconomic History   Marital status: Single    Spouse name: Not on file   Number of children: Not on file   Years of education: Not on file   Highest education level: Not on file  Occupational History   Not on file  Tobacco Use   Smoking status: Never   Smokeless tobacco: Never  Substance and Sexual Activity   Alcohol use: Not on file   Drug use: Not on file   Sexual activity: Not on file  Other Topics Concern   Not on file  Social History Narrative   4th grade   Social Determinants of Health   Financial Resource Strain: Not on file  Food Insecurity: Not on file  Transportation Needs: Not on file  Physical Activity: Not on file  Stress: Not on file  Social Connections: Not on file  Intimate Partner Violence: Not on file    Family History  Problem Relation Age of Onset    Cholelithiasis Maternal Aunt    Cholelithiasis Maternal Grandmother      Review of Systems  Constitutional: Negative.  Negative for chills and fever.  HENT: Negative.  Negative for congestion and sore throat.   Respiratory: Negative.  Negative for cough and shortness of breath.   Cardiovascular: Negative.  Negative for chest pain and palpitations.  Gastrointestinal: Negative.  Negative for abdominal pain, diarrhea, nausea and vomiting.  Genitourinary: Negative.   Skin: Negative.   Neurological: Negative.  Negative for dizziness and headaches.  All other systems reviewed and are negative. Today's Vitals   04/19/22 0812  BP: 112/68  Pulse: 88  Temp: 98 F (36.7 C)  TempSrc: Oral  SpO2: 98%  Weight: 162 lb 6 oz (73.7 kg)  Height: 5\' 4"  (1.626 m)   Body mass index is 27.87 kg/m.   Physical Exam Vitals reviewed.  Constitutional:      Appearance: Normal appearance.  HENT:     Head: Normocephalic.     Right Ear: Tympanic membrane, ear canal and external ear normal.     Left Ear: Tympanic membrane, ear canal and external ear normal.     Mouth/Throat:     Mouth: Mucous membranes are moist.     Pharynx: Oropharynx is clear.  Eyes:     Extraocular Movements: Extraocular movements intact.  Conjunctiva/sclera: Conjunctivae normal.     Pupils: Pupils are equal, round, and reactive to light.  Cardiovascular:     Rate and Rhythm: Normal rate and regular rhythm.     Pulses: Normal pulses.     Heart sounds: Normal heart sounds.  Pulmonary:     Effort: Pulmonary effort is normal.     Breath sounds: Normal breath sounds.  Musculoskeletal:        General: Normal range of motion.     Cervical back: Neck supple.  Lymphadenopathy:     Cervical: No cervical adenopathy.  Skin:    General: Skin is warm and dry.     Capillary Refill: Capillary refill takes less than 2 seconds.  Neurological:     General: No focal deficit present.     Mental Status: She is alert and oriented to  person, place, and time.  Psychiatric:        Mood and Affect: Mood normal.        Behavior: Behavior normal.     ASSESSMENT & PLAN: Problem List Items Addressed This Visit       Other   Congenital gastroschisis   Relevant Orders   Ambulatory referral to Gastroenterology   History of small bowel obstruction   Relevant Orders   Ambulatory referral to Gastroenterology   Other Visit Diagnoses     Routine general medical examination at a health care facility    -  Primary   Encounter to establish care       Need for hepatitis C screening test       Relevant Orders   Hepatitis C antibody screen   Screening for deficiency anemia       Relevant Orders   CBC with Differential/Platelet   Screening for lipoid disorders       Relevant Orders   Lipid panel   Screening for endocrine, metabolic and immunity disorder       Relevant Orders   Comprehensive metabolic panel   Hemoglobin A1c      Modifiable risk factors discussed with patient. Anticipatory guidance according to age provided. The following topics were also discussed: Social Determinants of Health Smoking.  Non-smoker Diet and nutrition Benefits of exercise Cancer family history review Vaccinations recommendations and review Cardiovascular risk assessment and need for blood work Mental health including depression and anxiety History of recurrent small bowel obstructions and need for follow-up with gastrointestinal doctor Fall and accident prevention  Patient Instructions  Health Maintenance, Female Adopting a healthy lifestyle and getting preventive care are important in promoting health and wellness. Ask your health care provider about: The right schedule for you to have regular tests and exams. Things you can do on your own to prevent diseases and keep yourself healthy. What should I know about diet, weight, and exercise? Eat a healthy diet  Eat a diet that includes plenty of vegetables, fruits, low-fat dairy  products, and lean protein. Do not eat a lot of foods that are high in solid fats, added sugars, or sodium. Maintain a healthy weight Body mass index (BMI) is used to identify weight problems. It estimates body fat based on height and weight. Your health care provider can help determine your BMI and help you achieve or maintain a healthy weight. Get regular exercise Get regular exercise. This is one of the most important things you can do for your health. Most adults should: Exercise for at least 150 minutes each week. The exercise should increase your heart rate and make  you sweat (moderate-intensity exercise). Do strengthening exercises at least twice a week. This is in addition to the moderate-intensity exercise. Spend less time sitting. Even light physical activity can be beneficial. Watch cholesterol and blood lipids Have your blood tested for lipids and cholesterol at 20 years of age, then have this test every 5 years. Have your cholesterol levels checked more often if: Your lipid or cholesterol levels are high. You are older than 20 years of age. You are at high risk for heart disease. What should I know about cancer screening? Depending on your health history and family history, you may need to have cancer screening at various ages. This may include screening for: Breast cancer. Cervical cancer. Colorectal cancer. Skin cancer. Lung cancer. What should I know about heart disease, diabetes, and high blood pressure? Blood pressure and heart disease High blood pressure causes heart disease and increases the risk of stroke. This is more likely to develop in people who have high blood pressure readings or are overweight. Have your blood pressure checked: Every 3-5 years if you are 23-14 years of age. Every year if you are 75 years old or older. Diabetes Have regular diabetes screenings. This checks your fasting blood sugar level. Have the screening done: Once every three years after  age 15 if you are at a normal weight and have a low risk for diabetes. More often and at a younger age if you are overweight or have a high risk for diabetes. What should I know about preventing infection? Hepatitis B If you have a higher risk for hepatitis B, you should be screened for this virus. Talk with your health care provider to find out if you are at risk for hepatitis B infection. Hepatitis C Testing is recommended for: Everyone born from [birth year] through 1964. Anyone with known risk factors for hepatitis C. Sexually transmitted infections (STIs) Get screened for STIs, including gonorrhea and chlamydia, if: You are sexually active and are younger than 20 years of age. You are older than 20 years of age and your health care provider tells you that you are at risk for this type of infection. Your sexual activity has changed since you were last screened, and you are at increased risk for chlamydia or gonorrhea. Ask your health care provider if you are at risk. Ask your health care provider about whether you are at high risk for HIV. Your health care provider may recommend a prescription medicine to help prevent HIV infection. If you choose to take medicine to prevent HIV, you should first get tested for HIV. You should then be tested every 3 months for as long as you are taking the medicine. Pregnancy If you are about to stop having your period (premenopausal) and you may become pregnant, seek counseling before you get pregnant. Take 400 to 800 micrograms (mcg) of folic acid every day if you become pregnant. Ask for birth control (contraception) if you want to prevent pregnancy. Osteoporosis and menopause Osteoporosis is a disease in which the bones lose minerals and strength with aging. This can result in bone fractures. If you are 10 years old or older, or if you are at risk for osteoporosis and fractures, ask your health care provider if you should: Be screened for bone loss. Take a  calcium or vitamin D supplement to lower your risk of fractures. Be given hormone replacement therapy (HRT) to treat symptoms of menopause. Follow these instructions at home: Alcohol use Do not drink alcohol if: Your health care  provider tells you not to drink. You are pregnant, may be pregnant, or are planning to become pregnant. If you drink alcohol: Limit how much you have to: 0-1 drink a day. Know how much alcohol is in your drink. In the [place] one drink equals one 12 oz bottle of beer (355 mL), one 5 oz glass of wine (148 mL), or one 1 oz glass of hard liquor (44 mL). Lifestyle Do not use any products that contain nicotine or tobacco. These products include cigarettes, chewing tobacco, and vaping devices, such as e-cigarettes. If you need help quitting, ask your health care provider. Do not use street drugs. Do not share needles. Ask your health care provider for help if you need support or information about quitting drugs. General instructions Schedule regular health, dental, and eye exams. Stay current with your vaccines. Tell your health care provider if: You often feel depressed. You have ever been abused or do not feel safe at home. Summary Adopting a healthy lifestyle and getting preventive care are important in promoting health and wellness. Follow your health care provider's instructions about healthy diet, exercising, and getting tested or screened for diseases. Follow your health care provider's instructions on monitoring your cholesterol and blood pressure. This information is not intended to replace advice given to you by your health care provider. Make sure you discuss any questions you have with your health care provider. Document Revised: 04/04/2021 Document Reviewed: 04/04/2021 Elsevier Patient Education  2023 [employer] Inc.       Edwina BarthMiguel Coalton Arch, MD [facility]

## 2022-04-20 LAB — HEPATITIS C ANTIBODY
Hepatitis C Ab: NONREACTIVE
SIGNAL TO CUT-OFF: 0.08 (ref <1.00)

## 2022-07-25 ENCOUNTER — Encounter: Payer: Self-pay | Admitting: Emergency Medicine

## 2022-07-25 ENCOUNTER — Ambulatory Visit (INDEPENDENT_AMBULATORY_CARE_PROVIDER_SITE_OTHER): Payer: Managed Care, Other (non HMO) | Admitting: Emergency Medicine

## 2022-07-25 VITALS — BP 110/82 | HR 86 | Temp 98.3°F | Ht 64.0 in | Wt 173.0 lb

## 2022-07-25 DIAGNOSIS — F909 Attention-deficit hyperactivity disorder, unspecified type: Secondary | ICD-10-CM

## 2022-07-25 NOTE — Progress Notes (Signed)
Sabrina Pierce 21 y.o.   Chief Complaint  Patient presents with   Follow-up    Patient is ADHD, stopped taking her ADHD medication. Patient want rx for ADHD medication , pt unsure of medication name     HISTORY OF PRESENT ILLNESS: This is a 21 y.o. female seen by me first time on 04/19/2022 to establish care. No mention of chronic conditions or chronic medications Now states that she has history of ADHD and was taking medication throughout high school.  Stopped about a year ago.  Wants to get back on medication. Does not remember name of medication. No other complaints or concerns today.  HPI   Prior to Admission medications   Medication Sig Start Date End Date Taking? Authorizing Provider  polyethylene glycol (MIRALAX / GLYCOLAX) 17 g packet Take 17 g by mouth 2 (two) times daily as needed. 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   Chronic constipation     Past Medical History:  Diagnosis Date   Abdominal pain    Constipation    Seasonal allergies     Past Surgical History:  Procedure Laterality Date   GASTROSCHISIS CLOSURE      Social History   Socioeconomic History   Marital status: Single    Spouse name: Not on file   Number of children: Not on file   Years of education: Not on file   Highest education level: Not on file  Occupational History   Not on file  Tobacco Use   Smoking status: Never   Smokeless tobacco: Never  Vaping Use   Vaping Use: Never used  Substance and Sexual Activity   Alcohol use: Never   Drug use: Never   Sexual activity: Never    Birth control/protection: None  Other Topics Concern   Not on file  Social History Narrative   4th grade   Social Determinants of Health   Financial Resource Strain: Not on  file  Food Insecurity: Not on file  Transportation Needs: Not on file  Physical Activity: Not on file  Stress: Not on file  Social Connections: Not on file  Intimate Partner Violence: Not on file    Family History  Problem Relation Age of Onset   Cholelithiasis Maternal Aunt    Cholelithiasis Maternal Grandmother      Review of Systems  Constitutional: Negative.  Negative for chills and fever.  HENT: Negative.    Cardiovascular: Negative.  Negative for chest pain and palpitations.  Gastrointestinal:  Negative for abdominal pain, nausea and vomiting.  Genitourinary: Negative.   Skin: Negative.   Neurological: Negative.  Negative for dizziness and headaches.  All other systems reviewed and are negative.  Today's Vitals   07/25/22 1417  BP: 110/82  Pulse: 86  Temp: 98.3 F (36.8 C)  TempSrc: Oral  SpO2: 99%  Weight: 173 lb (78.5 kg)  Height: 5\' 4"  (1.626 m)   Body mass index is 29.7 kg/m.   Physical Exam Vitals reviewed.  Constitutional:      Appearance: Normal appearance.  HENT:     Head: Normocephalic.  Eyes:     Extraocular Movements: Extraocular movements intact.  Cardiovascular:     Rate and Rhythm: Normal rate.  Pulmonary:     Effort: Pulmonary effort is normal.  Skin:    General: Skin is warm and dry.  Neurological:     Mental Status: She is alert and oriented to person, place, and time.  Psychiatric:        Mood and Affect: Mood normal.        Behavior: Behavior normal.      ASSESSMENT & PLAN: A total of 32 minutes was spent with the patient and counseling/coordination of care regarding preparing for this visit, review of most recent office visit notes, review of past medical history and medications, need for evaluation and treatment for ADHD by attention specialist group, prognosis, documentation, and need for follow-up.  Problem List Items Addressed This Visit       Other   Attention deficit hyperactivity disorder (ADHD) - Primary    Past  medical history.  Off medications for over a year.  Wants to restart it. Needs to follow-up with [place] [facility]. Referral placed today.      Relevant Orders   Ambulatory referral to Psychiatry   Patient Instructions  Attention Deficit Hyperactivity Disorder, Adult Attention deficit hyperactivity disorder (ADHD) is a mental health disorder that starts during childhood. For many people with ADHD, the disorder continues into the adult years. Treatment can help you manage your symptoms. There are three main types of ADHD: Inattentive. With this type, adults have difficulty paying attention. This may affect cognitive abilities. Hyperactive-impulsive. With this type, adults have a lot of energy and have difficulty controlling their behavior. Combination type. Some people may have symptoms of both types. What are the causes? The exact cause of ADHD is not known. Most experts believe a person's genes and environment possibly contribute to ADHD. What increases the risk? The following factors may make you more likely to develop this condition: Having a first-degree relative such as a parent, brother, or sister, with the condition. Being born before 37 weeks of pregnancy (prematurely) or at a low birth weight. Being born to a mother who smoked tobacco or drank alcohol during pregnancy. Having experienced a brain injury. Being exposed to lead or other toxins in the womb or early in life. What are the signs or symptoms? Symptoms of this condition depend on the type of ADHD. Symptoms of the inattentive type include: Difficulty paying attention or following instructions. Often making simple mistakes. Being disorganized. Avoiding tasks that require time and attention. Losing and forgetting things. Symptoms of the hyperactive-impulsive type include: Restlessness. Talking out of turn, interrupting others, or talking too much. Difficulty with: Sitting still. Feeling  motivated. Relaxing. Waiting in line or waiting for a turn. People with the combination type have symptoms of both of the other types. In adults, this condition may lead to certain problems, such as: Keeping jobs. Performing tasks at work. Having stable relationships. Being on time or keeping to a schedule. How is this diagnosed? This condition is diagnosed based on your current symptoms and your history of symptoms. The diagnosis can be made by a health care provider such as a primary care provider or a mental health care specialist. Your health care provider may use a symptom checklist or a behavior rating scale to evaluate your symptoms. Your health care provider may also want to talk with people who have observed your behaviors throughout your life. How is this treated? This condition can be treated with medicines and behavior therapy. Medicines may be the best option to reduce impulsive behaviors and improve attention. Your health care provider may recommend: Stimulant medicines. These are the  most common medicines used for adult ADHD. They affect certain chemicals in the brain (neurotransmitters) and improve your ability to control your symptoms. A non-stimulant medicine. These medicines can also improve focus, attention, and impulsive behavior. It may take weeks to months to see the effects of this medicine. Counseling and behavioral management are also important for treating ADHD. Counseling is often used along with medicine. Your health care provider may suggest: Cognitive behavioral therapy (CBT). This type of therapy teaches you to replace negative thoughts and actions with positive thoughts and actions. When used as part of ADHD treatment, this therapy may also include: Coping strategies for organization, time management, impulse control, and stress reduction. Mindfulness and meditation training. Behavioral management. You may work with a Psychologist, occupational who is specially trained to help people  with ADHD manage and organize activities and function more effectively. Follow these instructions at home: Medicines  Take over-the-counter and prescription medicines only as told by your health care provider. Talk with your health care provider about the possible side effects of your medicines and how to manage them. Alcohol use Do not drink alcohol if: Your health care provider tells you not to drink. You are pregnant, may be pregnant, or are planning to become pregnant. If you drink alcohol: Limit how much you use to: 0-1 drink a day for women. 0-2 drinks a day for men. Know how much alcohol is in your drink. In the [place] one drink equals one 12 oz bottle of beer (355 mL), one 5 oz glass of wine (148 mL), or one 1 oz glass of hard liquor (44 mL). Lifestyle  Do not use illegal drugs. Get enough sleep. Eat a healthy diet. Exercise regularly. Exercise can help to reduce stress and anxiety. General instructions Learn as much as you can about adult ADHD, and work closely with your health care providers to find the treatments that work best for you. Follow the same schedule each day. Use reminder devices like notes, calendars, and phone apps to stay on time and organized. Keep all follow-up visits. Your health care provider will need to monitor your condition and adjust your treatment over time. Where to find more information A health care provider may be able to recommend resources that are available online or over the phone. You could start with: Attention Deficit Disorder Association (ADDA): HotterNames.de [employer] [facility]): BloggerCourse.com Contact a health care provider if: Your symptoms continue to cause problems. You have side effects from your medicine, such as: Repeated muscle twitches, coughing, or speech outbursts. Sleep problems. Loss of appetite. Dizziness. Unusually fast heartbeat. Stomach pains. Headaches. You are struggling with anxiety, depression,  or substance abuse. Get help right away if: You have a severe reaction to a medicine. This symptom may be an emergency. Get help right away. Call 911. Do not wait to see if the symptom will go away. Do not drive yourself to the hospital. Take one of these steps if you feel like you may hurt yourself or others, or have thoughts about taking your own life: Go to your nearest emergency room. Call 911. Call the National Suicide Prevention Lifeline at 928-012-3778 or 988. This is open 24 hours a day Text the Crisis Text Line at (614)023-5730. Summary ADHD is a mental health disorder that starts during childhood and often continues into your adult years. The exact cause of ADHD is not known. Most experts believe genetics and environmental factors contribute to ADHD. There is no cure for ADHD, but treatment  with medicine, cognitive behavioral therapy, or behavioral management can help you manage your condition. This information is not intended to replace advice given to you by your health care provider. Make sure you discuss any questions you have with your health care provider. Document Revised: 03/03/2022 Document Reviewed: 03/03/2022 Elsevier Patient Education  2023 Elsevier Inc.    Edwina Barth, MD [facility]

## 2022-07-25 NOTE — Assessment & Plan Note (Signed)
Past medical history.  Off medications for over a year.  Wants to restart it. Needs to follow-up with [facility]. Referral placed today.

## 2022-07-25 NOTE — Patient Instructions (Signed)
Attention Deficit Hyperactivity Disorder, Adult Attention deficit hyperactivity disorder (ADHD) is a mental health disorder that starts during childhood. For many people with ADHD, the disorder continues into the adult years. Treatment can help you manage your symptoms. There are three main types of ADHD: Inattentive. With this type, adults have difficulty paying attention. This may affect cognitive abilities. Hyperactive-impulsive. With this type, adults have a lot of energy and have difficulty controlling their behavior. Combination type. Some people may have symptoms of both types. What are the causes? The exact cause of ADHD is not known. Most experts believe a person's genes and environment possibly contribute to ADHD. What increases the risk? The following factors may make you more likely to develop this condition: Having a first-degree relative such as a parent, brother, or sister, with the condition. Being born before 37 weeks of pregnancy (prematurely) or at a low birth weight. Being born to a mother who smoked tobacco or drank alcohol during pregnancy. Having experienced a brain injury. Being exposed to lead or other toxins in the womb or early in life. What are the signs or symptoms? Symptoms of this condition depend on the type of ADHD. Symptoms of the inattentive type include: Difficulty paying attention or following instructions. Often making simple mistakes. Being disorganized. Avoiding tasks that require time and attention. Losing and forgetting things. Symptoms of the hyperactive-impulsive type include: Restlessness. Talking out of turn, interrupting others, or talking too much. Difficulty with: Sitting still. Feeling motivated. Relaxing. Waiting in line or waiting for a turn. People with the combination type have symptoms of both of the other types. In adults, this condition may lead to certain problems, such as: Keeping jobs. Performing tasks at work. Having  stable relationships. Being on time or keeping to a schedule. How is this diagnosed? This condition is diagnosed based on your current symptoms and your history of symptoms. The diagnosis can be made by a health care provider such as a primary care provider or a mental health care specialist. Your health care provider may use a symptom checklist or a behavior rating scale to evaluate your symptoms. Your health care provider may also want to talk with people who have observed your behaviors throughout your life. How is this treated? This condition can be treated with medicines and behavior therapy. Medicines may be the best option to reduce impulsive behaviors and improve attention. Your health care provider may recommend: Stimulant medicines. These are the most common medicines used for adult ADHD. They affect certain chemicals in the brain (neurotransmitters) and improve your ability to control your symptoms. A non-stimulant medicine. These medicines can also improve focus, attention, and impulsive behavior. It may take weeks to months to see the effects of this medicine. Counseling and behavioral management are also important for treating ADHD. Counseling is often used along with medicine. Your health care provider may suggest: Cognitive behavioral therapy (CBT). This type of therapy teaches you to replace negative thoughts and actions with positive thoughts and actions. When used as part of ADHD treatment, this therapy may also include: Coping strategies for organization, time management, impulse control, and stress reduction. Mindfulness and meditation training. Behavioral management. You may work with a coach who is specially trained to help people with ADHD manage and organize activities and function more effectively. Follow these instructions at home: Medicines  Take over-the-counter and prescription medicines only as told by your health care provider. Talk with your health care provider  about the possible side effects of your medicines and   how to manage them. Alcohol use Do not drink alcohol if: Your health care provider tells you not to drink. You are pregnant, may be pregnant, or are planning to become pregnant. If you drink alcohol: Limit how much you use to: 0-1 drink a day for women. 0-2 drinks a day for men. Know how much alcohol is in your drink. In the [place] one drink equals one 12 oz bottle of beer (355 mL), one 5 oz glass of wine (148 mL), or one 1 oz glass of hard liquor (44 mL). Lifestyle  Do not use illegal drugs. Get enough sleep. Eat a healthy diet. Exercise regularly. Exercise can help to reduce stress and anxiety. General instructions Learn as much as you can about adult ADHD, and work closely with your health care providers to find the treatments that work best for you. Follow the same schedule each day. Use reminder devices like notes, calendars, and phone apps to stay on time and organized. Keep all follow-up visits. Your health care provider will need to monitor your condition and adjust your treatment over time. Where to find more information A health care provider may be able to recommend resources that are available online or over the phone. You could start with: Attention Deficit Disorder Association (ADDA): add.org National Institute of Mental Health (NIMH): nimh.nih.gov Contact a health care provider if: Your symptoms continue to cause problems. You have side effects from your medicine, such as: Repeated muscle twitches, coughing, or speech outbursts. Sleep problems. Loss of appetite. Dizziness. Unusually fast heartbeat. Stomach pains. Headaches. You are struggling with anxiety, depression, or substance abuse. Get help right away if: You have a severe reaction to a medicine. This symptom may be an emergency. Get help right away. Call 911. Do not wait to see if the symptom will go away. Do not drive yourself to the hospital. Take  one of these steps if you feel like you may hurt yourself or others, or have thoughts about taking your own life: Go to your nearest emergency room. Call 911. Call the National Suicide Prevention Lifeline at 1-800-273-8255 or 988. This is open 24 hours a day Text the Crisis Text Line at 741741. Summary ADHD is a mental health disorder that starts during childhood and often continues into your adult years. The exact cause of ADHD is not known. Most experts believe genetics and environmental factors contribute to ADHD. There is no cure for ADHD, but treatment with medicine, cognitive behavioral therapy, or behavioral management can help you manage your condition. This information is not intended to replace advice given to you by your health care provider. Make sure you discuss any questions you have with your health care provider. Document Revised: 03/03/2022 Document Reviewed: 03/03/2022 Elsevier Patient Education  2023 Elsevier Inc.  

## 2022-10-23 ENCOUNTER — Ambulatory Visit: Payer: Managed Care, Other (non HMO) | Admitting: Emergency Medicine

## 2022-12-11 ENCOUNTER — Telehealth: Payer: Self-pay | Admitting: Emergency Medicine

## 2022-12-11 NOTE — Telephone Encounter (Signed)
Patient will come in to the office to see provider for the referral request. She has an appointment on 12/14/2022

## 2022-12-11 NOTE — Telephone Encounter (Signed)
Patient would like a psych referral so she can continue to receive her medication for her adhd.  Please advise  Patients Number:  [id number]

## 2022-12-14 ENCOUNTER — Telehealth: Payer: Self-pay | Admitting: Emergency Medicine

## 2022-12-14 ENCOUNTER — Encounter: Payer: Self-pay | Admitting: Emergency Medicine

## 2022-12-14 ENCOUNTER — Ambulatory Visit: Payer: Managed Care, Other (non HMO) | Admitting: Emergency Medicine

## 2022-12-14 VITALS — BP 122/78 | HR 95 | Temp 98.4°F | Ht 64.0 in | Wt 188.0 lb

## 2022-12-14 DIAGNOSIS — F909 Attention-deficit hyperactivity disorder, unspecified type: Secondary | ICD-10-CM

## 2022-12-14 NOTE — Assessment & Plan Note (Signed)
Off medications at present time. Needs evaluation by behavioral health department Will refer to [place] [facility] for further evaluation and treatment.

## 2022-12-14 NOTE — Telephone Encounter (Signed)
I am okay with a transfer of care with this patient

## 2022-12-14 NOTE — Patient Instructions (Signed)

## 2022-12-14 NOTE — Progress Notes (Signed)
Sabrina Pierce 22 y.o.   Chief Complaint  Patient presents with   Referral    Psych or behavioral therapy    HISTORY OF PRESENT ILLNESS: This is a 22 y.o. female seen by me last August and referred to behavioral health department.  Somehow she was never contacted or scheduled for appointment. Has history of ADD and used to be on medication.  Electronics engineer.  Feels she needs to go back on her medications.  HPI   Prior to Admission medications   Medication Sig Start Date End Date Taking? Authorizing Provider  polyethylene glycol (MIRALAX / GLYCOLAX) 17 g packet Take 17 g by mouth 2 (two) times daily as needed. 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   Chronic constipation     Past Medical History:  Diagnosis Date   Abdominal pain    Constipation    Seasonal allergies     Past Surgical History:  Procedure Laterality Date   GASTROSCHISIS CLOSURE      Social History   Socioeconomic History   Marital status: Single    Spouse name: Not on file   Number of children: Not on file   Years of education: Not on file   Highest education level: Not on file  Occupational History   Not on file  Tobacco Use   Smoking status: Never   Smokeless tobacco: Never  Vaping Use   Vaping Use: Never used  Substance and Sexual Activity   Alcohol use: Never   Drug use: Never   Sexual activity: Never    Birth control/protection: None  Other Topics Concern   Not on file  Social History Narrative   4th grade   Social Determinants of Health   Financial Resource Strain: Not on file  Food Insecurity: Not on file  Transportation Needs: Not on file  Physical Activity: Not on file  Stress: Not on file   Social Connections: Not on file  Intimate Partner Violence: Not on file    Family History  Problem Relation Age of Onset   Cholelithiasis Maternal Aunt    Cholelithiasis Maternal Grandmother      Review of Systems  Constitutional: Negative.  Negative for chills and fever.  HENT: Negative.  Negative for congestion and sore throat.   Respiratory: Negative.  Negative for cough and shortness of breath.   Cardiovascular: Negative.   Gastrointestinal:  Negative for nausea and vomiting.  Skin: Negative.  Negative for rash.  Neurological: Negative.  Negative for dizziness and headaches.   Today's Vitals   12/14/22 1451  BP: 122/78  Pulse: 95  Temp: 98.4 F (36.9 C)  TempSrc: Oral  SpO2: 98%  Weight: 188 lb (85.3 kg)  Height: 5\' 4"  (1.626 m)   Body mass index is 32.27 kg/m.   Physical Exam Vitals reviewed.  Constitutional:      Appearance: Normal appearance.  HENT:     Head: Normocephalic.  Eyes:     Extraocular Movements: Extraocular movements intact.  Cardiovascular:     Rate and Rhythm: Normal rate.  Pulmonary:     Effort: Pulmonary effort is normal.  Skin:    General: Skin is warm and dry.  Neurological:     Mental Status: She is  alert and oriented to person, place, and time.  Psychiatric:        Mood and Affect: Mood normal.        Behavior: Behavior normal.      ASSESSMENT & PLAN: Problem List Items Addressed This Visit       Other   Attention deficit hyperactivity disorder (ADHD) - Primary    Off medications at present time. Needs evaluation by behavioral health department Will refer to [facility] for further evaluation and treatment.      Relevant Orders   Ambulatory referral to Psychiatry   Patient Instructions  Health Maintenance, Female Adopting a healthy lifestyle and getting preventive care are important in promoting health and wellness. Ask your health care provider about: The right schedule for you to have  regular tests and exams. Things you can do on your own to prevent diseases and keep yourself healthy. What should I know about diet, weight, and exercise? Eat a healthy diet  Eat a diet that includes plenty of vegetables, fruits, low-fat dairy products, and lean protein. Do not eat a lot of foods that are high in solid fats, added sugars, or sodium. Maintain a healthy weight Body mass index (BMI) is used to identify weight problems. It estimates body fat based on height and weight. Your health care provider can help determine your BMI and help you achieve or maintain a healthy weight. Get regular exercise Get regular exercise. This is one of the most important things you can do for your health. Most adults should: Exercise for at least 150 minutes each week. The exercise should increase your heart rate and make you sweat (moderate-intensity exercise). Do strengthening exercises at least twice a week. This is in addition to the moderate-intensity exercise. Spend less time sitting. Even light physical activity can be beneficial. Watch cholesterol and blood lipids Have your blood tested for lipids and cholesterol at 21 years of age, then have this test every 5 years. Have your cholesterol levels checked more often if: Your lipid or cholesterol levels are high. You are older than 21 years of age. You are at high risk for heart disease. What should I know about cancer screening? Depending on your health history and family history, you may need to have cancer screening at various ages. This may include screening for: Breast cancer. Cervical cancer. Colorectal cancer. Skin cancer. Lung cancer. What should I know about heart disease, diabetes, and high blood pressure? Blood pressure and heart disease High blood pressure causes heart disease and increases the risk of stroke. This is more likely to develop in people who have high blood pressure readings or are overweight. Have your blood pressure  checked: Every 3-5 years if you are 92-10 years of age. Every year if you are 19 years old or older. Diabetes Have regular diabetes screenings. This checks your fasting blood sugar level. Have the screening done: Once every three years after age 29 if you are at a normal weight and have a low risk for diabetes. More often and at a younger age if you are overweight or have a high risk for diabetes. What should I know about preventing infection? Hepatitis B If you have a higher risk for hepatitis B, you should be screened for this virus. Talk with your health care provider to find out if you are at risk for hepatitis B infection. Hepatitis C Testing is recommended for: Everyone born from [birth year] through 1964. Anyone with known risk factors for hepatitis C.  Sexually transmitted infections (STIs) Get screened for STIs, including gonorrhea and chlamydia, if: You are sexually active and are younger than 21 years of age. You are older than 21 years of age and your health care provider tells you that you are at risk for this type of infection. Your sexual activity has changed since you were last screened, and you are at increased risk for chlamydia or gonorrhea. Ask your health care provider if you are at risk. Ask your health care provider about whether you are at high risk for HIV. Your health care provider may recommend a prescription medicine to help prevent HIV infection. If you choose to take medicine to prevent HIV, you should first get tested for HIV. You should then be tested every 3 months for as long as you are taking the medicine. Pregnancy If you are about to stop having your period (premenopausal) and you may become pregnant, seek counseling before you get pregnant. Take 400 to 800 micrograms (mcg) of folic acid every day if you become pregnant. Ask for birth control (contraception) if you want to prevent pregnancy. Osteoporosis and menopause Osteoporosis is a disease in which the bones  lose minerals and strength with aging. This can result in bone fractures. If you are 37 years old or older, or if you are at risk for osteoporosis and fractures, ask your health care provider if you should: Be screened for bone loss. Take a calcium or vitamin D supplement to lower your risk of fractures. Be given hormone replacement therapy (HRT) to treat symptoms of menopause. Follow these instructions at home: Alcohol use Do not drink alcohol if: Your health care provider tells you not to drink. You are pregnant, may be pregnant, or are planning to become pregnant. If you drink alcohol: Limit how much you have to: 0-1 drink a day. Know how much alcohol is in your drink. In the [place] one drink equals one 12 oz bottle of beer (355 mL), one 5 oz glass of wine (148 mL), or one 1 oz glass of hard liquor (44 mL). Lifestyle Do not use any products that contain nicotine or tobacco. These products include cigarettes, chewing tobacco, and vaping devices, such as e-cigarettes. If you need help quitting, ask your health care provider. Do not use street drugs. Do not share needles. Ask your health care provider for help if you need support or information about quitting drugs. General instructions Schedule regular health, dental, and eye exams. Stay current with your vaccines. Tell your health care provider if: You often feel depressed. You have ever been abused or do not feel safe at home. Summary Adopting a healthy lifestyle and getting preventive care are important in promoting health and wellness. Follow your health care provider's instructions about healthy diet, exercising, and getting tested or screened for diseases. Follow your health care provider's instructions on monitoring your cholesterol and blood pressure. This information is not intended to replace advice given to you by your health care provider. Make sure you discuss any questions you have with your health care provider. Document  Revised: 04/04/2021 Document Reviewed: 04/04/2021 Elsevier Patient Education  [place], MD [facility]

## 2022-12-14 NOTE — Telephone Encounter (Signed)
I am okay with any transfer of care with this patient.

## 2022-12-14 NOTE — Telephone Encounter (Signed)
Patient is requesting to switch providers as their PCP. Is it ok to scheduling a transfer of care appt for this patient?

## 2023-01-24 ENCOUNTER — Encounter (HOSPITAL_COMMUNITY): Payer: Self-pay

## 2023-01-24 ENCOUNTER — Emergency Department (HOSPITAL_COMMUNITY)
Admission: EM | Admit: 2023-01-24 | Discharge: 2023-01-24 | Disposition: A | Discharge status: Home / Self Care | Payer: Managed Care, Other (non HMO) | Attending: Emergency Medicine | Admitting: Emergency Medicine

## 2023-01-24 ENCOUNTER — Emergency Department (HOSPITAL_COMMUNITY): Payer: Managed Care, Other (non HMO)

## 2023-01-24 ENCOUNTER — Other Ambulatory Visit: Payer: Self-pay

## 2023-01-24 DIAGNOSIS — K5900 Constipation, unspecified: Secondary | ICD-10-CM | POA: Diagnosis present

## 2023-01-24 LAB — URINALYSIS, ROUTINE W REFLEX MICROSCOPIC
Bacteria, UA: NONE SEEN
Bilirubin Urine: NEGATIVE
Glucose, UA: NEGATIVE mg/dL
Ketones, ur: NEGATIVE mg/dL
Leukocytes,Ua: NEGATIVE
Nitrite: NEGATIVE
Protein, ur: NEGATIVE mg/dL
Specific Gravity, Urine: 1.014 (ref 1.005–1.030)
pH: 5 (ref 5.0–8.0)

## 2023-01-24 LAB — COMPREHENSIVE METABOLIC PANEL
ALT: 36 U/L (ref 0–44)
AST: 29 U/L (ref 15–41)
Albumin: 5.1 g/dL — ABNORMAL HIGH (ref 3.5–5.0)
Alkaline Phosphatase: 57 U/L (ref 38–126)
Anion gap: 8 (ref 5–15)
BUN: 9 mg/dL (ref 6–20)
CO2: 23 mmol/L (ref 22–32)
Calcium: 9.3 mg/dL (ref 8.9–10.3)
Chloride: 106 mmol/L (ref 98–111)
Creatinine, Ser: 0.77 mg/dL (ref 0.44–1.00)
GFR, Estimated: >60 mL/min (ref >60)
Glucose, Bld: 89 mg/dL (ref 70–99)
Potassium: 3.7 mmol/L (ref 3.5–5.1)
Sodium: 137 mmol/L (ref 135–145)
Total Bilirubin: 0.5 mg/dL (ref 0.3–1.2)
Total Protein: 8.3 g/dL — ABNORMAL HIGH (ref 6.5–8.1)

## 2023-01-24 LAB — LIPASE, BLOOD: Lipase: 51 U/L (ref 11–51)

## 2023-01-24 MED ORDER — IOHEXOL 300 MG/ML  SOLN
100.0000 mL | Freq: Once | INTRAMUSCULAR | Status: AC | PRN
  Administered 2023-01-24: 100 mL via INTRAVENOUS

## 2023-01-24 NOTE — ED Provider Notes (Signed)
[facility] Provider Note   CSN: [id number] Arrival date & time: 01/24/23  1225     History  Chief Complaint  Patient presents with   Abdominal Pain    Sabrina Pierce is a 22 y.o. female with a past medical history of chronic constipation and multiple small bowel obstructions, most recent in February 2023 presenting today with the concern of a bowel obstruction.  She reports that she was dealing with some constipation and went to a clinic today that did an abdominal plain film that said she has a bowel obstruction.  She tells me that she is still having bowel movements, most recently last night and is still passing gas.  She says that her bowel movements have been large and the gas is also frequent.  No nausea or vomiting.  History of cholecystectomy   Abdominal Pain      Home Medications Prior to Admission medications   Medication Sig Start Date End Date Taking? Authorizing Provider  polyethylene glycol (MIRALAX / GLYCOLAX) 17 g packet Take 17 g by mouth 2 (two) times daily as needed. Patient not taking: Reported on 04/19/2022 01/06/22   Jillyn Ledger, PA-C      Allergies    Patient has no known allergies.    Review of Systems   Review of Systems  Gastrointestinal:  Positive for abdominal pain.    Physical Exam Updated Vital Signs Ht '5\' 4"'$  (1.626 m)   Wt 83 kg   LMP 01/21/2023   BMI 31.41 kg/m  Physical Exam Vitals and nursing note reviewed.  Constitutional:      Appearance: Normal appearance.  HENT:     Head: Normocephalic and atraumatic.  Eyes:     General: No scleral icterus.    Conjunctiva/sclera: Conjunctivae normal.  Pulmonary:     Effort: Pulmonary effort is normal. No respiratory distress.  Abdominal:     General: Abdomen is flat.     Palpations: Abdomen is soft.     Tenderness: There is generalized abdominal tenderness.     Hernia: No hernia is present.  Skin:    General: Skin is warm and dry.      Findings: No rash.  Neurological:     Mental Status: She is alert.  Psychiatric:        Mood and Affect: Mood normal.     ED Results / Procedures / Treatments   Labs (all labs ordered are listed, but only abnormal results are displayed) Labs Reviewed  COMPREHENSIVE METABOLIC PANEL - Abnormal; Notable for the following components:      Result Value   Total Protein 8.3 (*)    Albumin 5.1 (*)    All other components within normal limits  URINALYSIS, ROUTINE W REFLEX MICROSCOPIC - Abnormal; Notable for the following components:   Color, Urine STRAW (*)    Hgb urine dipstick MODERATE (*)    All other components within normal limits  LIPASE, BLOOD  I-STAT BETA HCG BLOOD, ED ([facility], [facility], [facility] ONLY)    EKG None  Radiology No results found.  Procedures Procedures   Medications Ordered in ED Medications  iohexol (OMNIPAQUE) 300 MG/ML solution 100 mL (100 mLs Intravenous Contrast Given 01/24/23 1516)    ED Course/ Medical Decision Making/ A&P                             Medical Decision Making Amount and/or Complexity of Data Reviewed  Labs: ordered. Radiology: ordered.  Risk Prescription drug management.   22 year old female with a history of cholecystectomy and bowel obstruction presenting today with concern of bowel obstruction on plain film.  Differential includes but is not limited to partial obstruction, ileus, total obstruction, severe constipation.    This is not an exhaustive differential.    Past Medical History / Co-morbidities / Social History: Cholecystectomy and previous partial bowel obstructions   Additional history: Per chart review patient follows outpatient due to chronic constipation.  She tells me that she no longer takes medications for this.  Was seen at [facility] in 2022 for small bowel obstruction.  Again was seen by general surgery in February 2023 with a small bowel obstruction.  Both were managed nonoperatively   Physical  Exam: Pertinent physical exam findings include Generally tender abdomen  Lab Tests: I ordered, and personally interpreted labs.  The pertinent results include: Hematuria likely contaminant from patient's menses Normal electrolytes   Imaging Studies: I ordered and independently visualized and interpreted CT abdomen pelvis and I agree with the radiologist that there is constipation but no clear obstruction       Consultations Obtained: No need to consult general surgery, no obstruction on CT and also clinically she is still having bowel movements and passing gas  MDM/Disposition: This is a 22 year old female who presented today with concern for bowel obstruction.  Has a history of similar.  Had an outpatient x-ray that was concerning for this.  CT negative.  Electrolytes within normal limits.  Patient is still having bowel movements and passing gas.  Suspect she is just very constipated.  She is supposed to be on medications every day but she does not take them.  She has been instructed to restart her MiraLAX and follow-up with GI.  Given instructions for MiraLAX cleanout.  She is agreeable.    Final Clinical Impression(s) / ED Diagnoses Final diagnoses:  Constipation, unspecified constipation type    Rx / DC Orders ED Discharge Orders     None      Results and diagnoses were explained to the patient. Return precautions discussed in full. Patient had no additional questions and expressed complete understanding.   This chart was dictated using voice recognition software.  Despite best efforts to proofread,  errors can occur which can change the documentation meaning.     Darliss Ridgel 01/24/23 1617    Tegeler, Gwenyth Allegra, MD 01/24/23 [id number]

## 2023-01-24 NOTE — ED Triage Notes (Signed)
Patient reports that she is here for possible bowel movement. Pt reports passing gas and had a bowel movement last night. Denies any nausea or vomiting today. Patient has been having pain X a few days. Has a history of bowel obstructions.

## 2023-01-24 NOTE — Discharge Instructions (Addendum)
You came to the emergency department with a concern of a bowel obstruction.  The CT scan did not show an obstruction but it did show constipation.    I have placed a referral to gastroenterology and they should call you within the next 3 days for an appointment.  As we discussed, you may mix an entire bottle of MiraLAX with 2 L of Powerade or Gatorade and drink 1 cup every hour.  This should help with your constipation over the next couple of days.  I have attached a school note as well.  It was a pleasure to meet you and we hope you feel better.  Do not hesitate to return with any worsening or recurring symptoms.

## 2023-01-25 ENCOUNTER — Telehealth: Payer: Self-pay

## 2023-01-25 NOTE — Transitions of Care (Post Inpatient/ED Visit) (Signed)
   01/25/2023  Name: Sabrina Pierce MRN: [id number] DOB: 2001/01/21  Today's TOC FU Call Status: Today's TOC FU Call Status:: Successful TOC FU Call Competed TOC FU Call Complete Date: 01/25/23  Transition Care Management Follow-up Telephone Call Date of Discharge: 01/24/23 Discharge Facility: [facility] Type of Discharge: Emergency Department Reason for ED Visit: Other: (constipation) How have you been since you were released from the hospital?: Better Any questions or concerns?: No  Items Reviewed: Did you receive and understand the discharge instructions provided?: Yes Medications obtained and verified?: Yes (Medications Reviewed) Any new allergies since your discharge?: No Dietary orders reviewed?: Yes Do you have support at home?: No  Home Care and Equipment/Supplies: [place] Ordered?: NA Any new equipment or medical supplies ordered?: NA  Functional Questionnaire: Do you need assistance with bathing/showering or dressing?: No Do you need assistance with meal preparation?: No Do you need assistance with eating?: No Do you have difficulty maintaining continence: No Do you need assistance with getting out of bed/getting out of a chair/moving?: No Do you have difficulty managing or taking your medications?: No  Folllow up appointments reviewed: PCP Follow-up appointment confirmed?: No (sent message to staff to schedule) MD Provider Line Number:[phone] Given: No [facility] Follow-up appointment confirmed?: NA Do you need transportation to your follow-up appointment?: No Do you understand care options if your condition(s) worsen?: Yes-patient verbalized understanding    [place], [place] Nurse Health Advisor Direct Dial [phone]

## 2023-01-31 ENCOUNTER — Telehealth: Payer: Self-pay | Admitting: Emergency Medicine

## 2023-01-31 NOTE — Telephone Encounter (Signed)
Pt is requesting to switch providers as their PCP is okay to schedule a transfer of care appt for pt?

## 2023-01-31 NOTE — Telephone Encounter (Signed)
I do not have any objection to this.

## 2023-02-01 NOTE — Telephone Encounter (Signed)
Fine keep care with PCP until [facility] visit

## 2023-02-02 ENCOUNTER — Encounter (HOSPITAL_COMMUNITY): Payer: Self-pay | Admitting: Psychiatry

## 2023-02-02 ENCOUNTER — Ambulatory Visit (HOSPITAL_BASED_OUTPATIENT_CLINIC_OR_DEPARTMENT_OTHER): Payer: 59 | Admitting: Psychiatry

## 2023-02-02 DIAGNOSIS — F320 Major depressive disorder, single episode, mild: Secondary | ICD-10-CM

## 2023-02-02 DIAGNOSIS — F411 Generalized anxiety disorder: Secondary | ICD-10-CM | POA: Diagnosis not present

## 2023-02-02 DIAGNOSIS — F909 Attention-deficit hyperactivity disorder, unspecified type: Secondary | ICD-10-CM | POA: Diagnosis not present

## 2023-02-02 MED ORDER — ATOMOXETINE HCL 10 MG PO CAPS: 10.0000 mg | ORAL_CAPSULE | Freq: Every day | ORAL | 1 refills | Status: DC

## 2023-02-02 MED ORDER — ESCITALOPRAM OXALATE 5 MG PO TABS: 5.0000 mg | ORAL_TABLET | Freq: Every day | ORAL | 1 refills | Status: DC

## 2023-02-02 NOTE — Progress Notes (Signed)
Psychiatric Initial Adult Assessment   Patient Identification: Sabrina Pierce MRN:  [id number] Date of Evaluation:  02/02/2023 Referral Source: PCP Chief Complaint:   Chief Complaint  Patient presents with   Establish Care   ADHD   Visit Diagnosis:    ICD-10-CM   1. GAD (generalized anxiety disorder)  F41.1 escitalopram (LEXAPRO) 5 MG tablet    2. Attention deficit hyperactivity disorder (ADHD), unspecified ADHD type  F90.9 atomoxetine (STRATTERA) 10 MG capsule    3. Current mild episode of major depressive disorder without prior episode [facility])  F32.0        Assessment:  Sabrina Pierce is a 22 y.o. female with a history of reported ADHD who presents virtually to [facility] for initial evaluation on 02/02/2023.  During initial evaluation patient reports neurovegetative symptoms of depression including low mood, anhedonia, worthlessness, disturbed sleep, fluctuating appetite, poor concentration, and passive SI without intent or plan.  She also endorsed significant symptoms of anxiety including constant worry, irritability, restlessness, and difficulty relaxing that has impacted her physical health.  Patient does have a prior diagnosis of ADHD and is scheduled for neuropsych testing at [facility] next week.  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  Patient meets criteria for MDD and generalized anxiety disorder at this time with likely ADHD which will be confirmed through neuropsych testing.  A number of assessments were performed during the evaluation today including PHQ-9 which they scored a 14 on, GAD-7 which they scored a 8 on, and [place] suicide severity screening which showed low risk.  Based on these assessments patient would benefit from medication adjustment to better target their symptoms.  Plan: - Start Strattera 10 mg QD - Start Lexapro 5 mg QD - Therapy referral - CMP, CBC, lipid profile  reviewed - Neuropsych referral, appointment next week - Crisis resources reviewed - Follow up in 1 month  History of Present Illness: Sabrina Pierce presents reporting she is here to establish care and restarted on her ADHD medications.  She notes that she was diagnosed with ADHD in middle school And Had Been on Medication until she took a break from college.  At that time she had taken off to help her little sister.  After returning to college she felt that she could still focus was having difficulty concentrating on the important details of things and performing as well in class without the medications.  Patient notes that she had testing by a psychologist when she was first diagnosed and no school and felt she might go to bring his records in.  She had also mentioned that a professor at college thoughts that patient might have autism and she has been referred for neuropsych testing.  She has an appointment next week at [facility] for this and we encouraged her to ask them about ADHD testing as typical neuropsych testing would evaluate for that as well.  We did discuss starting a nonstimulant medication for ADHD symptoms while we await the testing and reminded her to hold the medication the day of the test.  During the assessment patient also endorsed symptoms of anxiety and depression.  She notes that a lot of these are related to her performance at school and expectation from her family.  She notes that the anxiety reached a point where it was affecting her physically and she had to present to the ED for GI blockage.  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  Ever since then she notes that when she walks on campus her anxiety flares up, she feels her insides shut down both in terms of bowel movements and hunger. On screening for anxiety patient did endorse being unable to stop or control her worrying in  addition to worrying about too many things, difficulty relaxing, restlessness, and increased irritability.  Screening for depression but also performed and she endorsed symptoms of anhedonia, low mood, disturbed sleep, poor energy, decreased appetite, negative self thoughts, difficulty concentrating, and passive thoughts of SI.  She denies any intent or plan.  On exploration of her suicidal ideation patient describes it as thoughts about how difficult it is for her to live up to therapy's expectations.  Especially since she feels she had a hard time processing compared to others and does not have appropriate adjustments in place.  We discussed treatment for this and explained that his anxiety and ADHD symptoms can overlap.  Based off her current presentation we recommended starting on a medication to treat both the anxiety and depressive symptoms.  Discussed Lexapro and went over the risk and benefits of this medication.  We also reviewed therapy options and patient expressed an interest in this despite having a negative experience with therapy in the past.  She was provided with therapy resources and different types of therapy were discussed.  Associated Signs/Symptoms: Depression Symptoms:  depressed mood, anhedonia, fatigue, feelings of worthlessness/guilt, anxiety, loss of energy/fatigue, disturbed sleep, (Hypo) Manic Symptoms:  Irritable Mood, Anxiety Symptoms:  Excessive Worry, Psychotic Symptoms:   Denies PTSD Symptoms: NA  Past Psychiatric History: Patient denies any prior psychiatric care other than being diagnosed with ADHD in middle school.  She had been started on Adderall and continued that medication until she took a break from college.  At college she had met with a therapist a couple times but felt was a poor connection.  Patient has tried Adderall extended release 15 mg in the past.  Currently on Lexapro 5 mg and Strattera 10 mg.  Patient denies any substance use.  Previous  Psychotropic Medications: Yes   Substance Abuse History in the last 12 months:  No.  Consequences of Substance Abuse: NA  Past Medical History:  Past Medical History:  Diagnosis Date   Abdominal pain    Constipation    Seasonal allergies     Past Surgical History:  Procedure Laterality Date   GASTROSCHISIS CLOSURE      Family Psychiatric History: Unsure  Family History:  Family History  Problem Relation Age of Onset   Cholelithiasis Maternal Aunt    Cholelithiasis Maternal Grandmother     Social History:   Social History   Socioeconomic History   Marital status: Single    Spouse name: Not on file   Number of children: Not on file   Years of education: Not on file   Highest education level: Not on file  Occupational History   Not on file  Tobacco Use   Smoking status: Never   Smokeless tobacco: Never  Vaping Use   Vaping Use: Never used  Substance and Sexual Activity   Alcohol use: Never   Drug use: Never   Sexual activity: Never    Birth control/protection: None  Other Topics Concern   Not on file  Social History Narrative   4th grade   Social Determinants of Health   Financial Resource Strain: Not on file  Food Insecurity: Not on file  Transportation Needs: Not on file  Physical Activity: Not on file  Stress: Not on file  Social Connections: Not on file    Additional Social History: Patient is attending college at [place]:  No Known Allergies  Metabolic Disorder Labs: Lab Results  Component Value Date   HGBA1C 5.4 04/19/2022   No results found for: "PROLACTIN" Lab Results  Component Value Date   CHOL 140 04/19/2022   TRIG 137.0 04/19/2022   HDL 33.00 (L) 04/19/2022   CHOLHDL 4 04/19/2022   VLDL 27.4 04/19/2022   Lott 79 04/19/2022   No results found for: "TSH"  Therapeutic Level Labs: No results found for: "LITHIUM" No results found for: "CBMZ" No results found for: "VALPROATE"  Current  Medications: Current Outpatient Medications  Medication Sig Dispense Refill   atomoxetine (STRATTERA) 10 MG capsule Take 1 capsule (10 mg total) by mouth daily. 30 capsule 1   escitalopram (LEXAPRO) 5 MG tablet Take 1 tablet (5 mg total) by mouth daily. 30 tablet 1   polyethylene glycol (MIRALAX / GLYCOLAX) 17 g packet Take 17 g by mouth 2 (two) times daily as needed. (Patient not taking: Reported on 04/19/2022) 14 each 0   No current facility-administered medications for this visit.    Psychiatric Specialty Exam: Review of Systems  Last menstrual period 01/21/2023.There is no height or weight on file to calculate BMI.  General Appearance: Fairly Groomed  Eye Contact:  Fair  Speech:  Clear and Coherent, Normal Rate, and hesitant at times  Volume:  Normal  Mood:  Anxious and Depressed  Affect:  Congruent  Thought Process:  Coherent  Orientation:  Full (Time, Place, and Person)  Thought Content:  Logical  Suicidal Thoughts:  No  Homicidal Thoughts:  No  Memory:  Immediate;   Fair  Judgement:  Fair  Insight:  Fair  Psychomotor Activity:  Normal  Concentration:  Concentration: Fair  Recall:  Good  Fund of [place]  Language: Good  Akathisia:  NA    AIMS (if indicated):  not done  Assets:  Communication Skills Desire for Improvement Financial Resources/Insurance Housing Transportation Vocational/Educational  ADL's:  Intact  Cognition: WNL  Sleep:  Fair   Screenings: GAD-7    [place] Office Visit from 02/02/2023 in [facility]  Total GAD-7 Score 8      PHQ2-9    [place] Office Visit from 02/02/2023 in [facility] [facility] Office Visit from 12/14/2022 in [place] [facility] [place] Visit from 07/25/2022 in [facility] [place] Visit from 04/19/2022 in [place] [facility] [place]  PHQ-2 Total Score 2 0 1 1   PHQ-9 Total Score '14 2 4 '$ --      [place] Visit from 02/02/2023 in [facility] from 01/24/2023 in [facility] Emergency Department at [facility] ED to Hosp-Admission (Discharged) from 01/04/2022 in [facility] [place] [facility]  C-SSRS RISK CATEGORY No Risk No Risk No Risk        Collaboration of Care: Medication Management AEB medication prescription, Primary Care Provider AEB chart review, and Referral or follow-up with counselor/therapist AEB referral  Patient/Guardian was advised Release of Information must be obtained prior to any record release in order to collaborate their care with an outside provider. Patient/Guardian was advised if they have not already done so to contact the registration department  to sign all necessary forms in order for [place] to release information regarding their care.   Consent: Patient/Guardian gives verbal consent for treatment and assignment of benefits for services provided during this visit. Patient/Guardian expressed understanding and agreed to proceed.   Vista Mink, MD 3/8/20241:55 PM  90 minutes were spent in chart review, interview, psycho education, counseling, medical decision making, coordination of care and long-term prognosis.  Patient was given opportunity to ask question and all concerns and questions were addressed and answers. Excluding separately billable services.   Virtual Visit via Video Note  I connected with Sabrina Pierce on 02/02/23 at  9:00 AM EST by a video enabled telemedicine application and verified that I am speaking with the correct person using two identifiers.  Location: Patient: Home Provider: Home office   I discussed the limitations of evaluation and management by telemedicine and the availability of in person appointments. The patient expressed understanding and agreed to proceed.   I discussed the assessment and treatment plan with the patient. The  patient was provided an opportunity to ask questions and all were answered. The patient agreed with the plan and demonstrated an understanding of the instructions.   The patient was advised to call back or seek an in-person evaluation if the symptoms worsen or if the condition fails to improve as anticipated.  I provided 70 minutes of non-face-to-face time during this encounter.   Vista Mink, MD

## 2023-03-06 ENCOUNTER — Encounter (HOSPITAL_COMMUNITY): Payer: Self-pay | Admitting: Psychiatry

## 2023-03-06 ENCOUNTER — Telehealth (HOSPITAL_BASED_OUTPATIENT_CLINIC_OR_DEPARTMENT_OTHER): Payer: 59 | Admitting: Psychiatry

## 2023-03-06 DIAGNOSIS — F320 Major depressive disorder, single episode, mild: Secondary | ICD-10-CM | POA: Diagnosis not present

## 2023-03-06 DIAGNOSIS — F411 Generalized anxiety disorder: Secondary | ICD-10-CM | POA: Diagnosis not present

## 2023-03-06 DIAGNOSIS — F909 Attention-deficit hyperactivity disorder, unspecified type: Secondary | ICD-10-CM | POA: Diagnosis not present

## 2023-03-06 MED ORDER — LISDEXAMFETAMINE DIMESYLATE 10 MG PO CAPS: 10.0000 mg | ORAL_CAPSULE | Freq: Every day | ORAL | 0 refills | Status: DC

## 2023-03-06 NOTE — Progress Notes (Signed)
BH MD/PA/NP OP Progress Note  03/06/2023 9:02 AM Sabrina Pierce  MRN:  [id number]  Visit Diagnosis:    ICD-10-CM   1. GAD (generalized anxiety disorder)  F41.1     2. Attention deficit hyperactivity disorder (ADHD), unspecified ADHD type  F90.9     3. Current mild episode of major depressive disorder without prior episode  F32.0       Assessment: Sabrina Pierce is a 22 y.o. female with a history of reported ADHD who [facility] on 02/02/2023.  During initial evaluation patient reported neurovegetative symptoms of depression including low mood, anhedonia, worthlessness, disturbed sleep, fluctuating appetite, poor concentration, and passive SI without intent or plan.  She also endorsed significant symptoms of anxiety including constant worry, irritability, restlessness, and difficulty relaxing that has impacted her physical health.  Patient does have a prior diagnosis of ADHD completed [facility].  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  Patient meets criteria for MDD and generalized anxiety disorder at this time with likely ADHD which will be confirmed through neuropsych testing.  Sabrina Pierce presents for follow-up evaluation. Today, 03/06/23, patient reports an improvement in her anxiety symptoms after starting the Lexapro with no adverse side effects.  She did decrease the use of Lexapro to as needed due to family pressures and cultural reasons.  Education regarding Lexapro and its mechanism of action were explained and patient was encouraged to try to take it regularly in addition to attending therapy with the goal of managing her anxiety symptoms in a quicker fashion.  As for the Strattera patient felt excessively fidgety and discontinued medication.  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   Patient will stand in the testing for review.  She also notes that they could not diagnosis or rule out autism during the testing and she is going to be referred to a specialist for further evaluation.  Patient is scheduled to start therapy later this month.  Plan: - Start Vyvanse 10 mg QD - Continue Lexapro 5 mg QD - Discontinue Strattera 10 mg QD - Start therapy later this month - CMP, CBC, lipid profile reviewed - Neuropsych testing to be sent in by patient - Crisis resources reviewed - Follow up in 1 month    Chief Complaint:  Chief Complaint  Patient presents with   Follow-up   HPI: Sabrina EulerBrynn presents reporting that she finished the neuropsych testing, but needs to get the paperwork for it still. She believes there were some signs of autism on the testing, but not enough for an official dx. She was referred to a specialist for further clarity.  Patient believes that the testing did show ADD and learning processing disorder.  She is planning to send in the records to this office for review.  In regards to her ADD symptoms patient notes that she had tried Strattera however but felt that it made her excessively fidgety to the point of restlessness.  Patient reports feeling very out of control of her body and will stop the medication after talking with the provider doing the neuropsych testing.  We discussed alternative medications such as Vyvanse who is long acting nature could hopefully decrease the likelihood of the symptoms occurring.  Patient was open to trying to medication risk and benefits were discussed.  She notes that she does not plan to  take it every day and said improper use it on a scheduled basis of around 3 times a week.  In regards to anxiety patient noted a marked benefit after starting Lexapro.  She described being able to walk around campus without issue and engage in situations without that constant worry.  She took it every day for the first week however due to influence  from family she decreased that use to as needed.  Per patient there is a cultural side of the family where they feel medications should not be relied on.  We explained how Lexapro works and the need for developing a system as well as the benefits of fully targeting the anxiety now through medication therapy with a goal of getting off the medications in the future.  We reviewed how using a combination of medications and therapy now could help her resolve her anxiety in a quicker fashion than if she were to just rely on coping/therapy and intermittent medication use.  Patient was open to trying this and explaining this to her family.  Past Psychiatric History: Patient denies any prior psychiatric care other than being diagnosed with ADHD in middle school.  She had been started on Adderall and continued that medication until she took a break from college.  At college she had met with a therapist a couple times but felt was a poor connection.  Patient has tried Adderall extended release 15 mg in the past.  Currently on Lexapro 5 mg.  Patient denies any substance use.  Past Medical History:  Past Medical History:  Diagnosis Date   Abdominal pain    Constipation    Seasonal allergies     Past Surgical History:  Procedure Laterality Date   GASTROSCHISIS CLOSURE      Family History:  Family History  Problem Relation Age of Onset   Cholelithiasis Maternal Aunt    Cholelithiasis Maternal Grandmother     Social History:  Social History   Socioeconomic History   Marital status: Single    Spouse name: Not on file   Number of children: Not on file   Years of education: Not on file   Highest education level: Not on file  Occupational History   Not on file  Tobacco Use   Smoking status: Never   Smokeless tobacco: Never  Vaping Use   Vaping Use: Never used  Substance and Sexual Activity   Alcohol use: Never   Drug use: Never   Sexual activity: Never    Birth control/protection: None   Other Topics Concern   Not on file  Social History Narrative   4th grade   Social Determinants of Health   Financial Resource Strain: Not on file  Food Insecurity: Not on file  Transportation Needs: Not on file  Physical Activity: Not on file  Stress: Not on file  Social Connections: Not on file    Allergies: No Known Allergies  Current Medications: Current Outpatient Medications  Medication Sig Dispense Refill   atomoxetine (STRATTERA) 10 MG capsule Take 1 capsule (10 mg total) by mouth daily. 30 capsule 1   escitalopram (LEXAPRO) 5 MG tablet Take 1 tablet (5 mg total) by mouth daily. 30 tablet 1   polyethylene glycol (MIRALAX / GLYCOLAX) 17 g packet Take 17 g by mouth 2 (two) times daily as needed. (Patient not taking: Reported on 04/19/2022) 14 each 0   No current facility-administered medications for this visit.     Psychiatric Specialty Exam: Review of Systems  There  were no vitals taken for this visit.There is no height or weight on file to calculate BMI.  General Appearance: Neat and Well Groomed  Eye Contact:  Good  Speech:  Clear and Coherent  Volume:  Normal  Mood:  Anxious and Euthymic  Affect:  Congruent  Thought Process:  Goal Directed  Orientation:  Full (Time, Place, and Person)  Thought Content: Logical   Suicidal Thoughts:  No  Homicidal Thoughts:  No  Memory:  Immediate;   Good  Judgement:  Fair  Insight:  Fair  Psychomotor Activity:  Normal  Concentration:  Concentration: Good  Recall:  Good  Fund of Knowledge: Fair  Language: Good  Akathisia:  NA    AIMS (if indicated): not done  Assets:  Communication Skills Desire for Improvement Financial Resources/Insurance Housing Physical Health Social Support Talents/Skills Transportation Vocational/Educational  ADL's:  Intact  Cognition: WNL  Sleep:  Good   Metabolic Disorder Labs: Lab Results  Component Value Date   HGBA1C 5.4 04/19/2022   No results found for: "PROLACTIN" Lab  Results  Component Value Date   CHOL 140 04/19/2022   TRIG 137.0 04/19/2022   HDL 33.00 (L) 04/19/2022   CHOLHDL 4 04/19/2022   VLDL 27.4 04/19/2022   LDLCALC 79 04/19/2022   No results found for: "TSH"  Therapeutic Level Labs: No results found for: "LITHIUM" No results found for: "VALPROATE" No results found for: "CBMZ"   Screenings: GAD-7    Flowsheet Row [facility]  Total GAD-7 Score 8      PHQ2-9    Flowsheet Row [facility] Office Visit from 12/14/2022 in [facility] [place] Office Visit from 07/25/2022 in [facility] [place] Office Visit from 04/19/2022 in [facility] [place]  PHQ-2 Total Score 2 0 1 1  PHQ-9 Total Score 14 2 4  --      Flowsheet Row Office [facility] from [facility]/24/2023 in [facility] at [facility] ED to Hosp-Admission (Discharged) from 01/04/2022 in [facility] [place] [facility]  C-SSRS RISK CATEGORY No Risk No Risk No Risk       Collaboration of Care: Collaboration of Care: Medication Management AEB medication prescription  Patient/Guardian was advised Release of Information must be obtained prior to any record release in order to collaborate their care with an outside provider. Patient/Guardian was advised if they have not already done so to contact the registration department to sign all necessary forms in order for [place] to release information regarding their care.   Consent: Patient/Guardian gives verbal consent for treatment and assignment of benefits for services provided during this visit. Patient/Guardian expressed understanding and agreed to proceed.    Stasia Cavalier, MD 03/06/2023, 9:02 AM   Virtual Visit via Video Note  I connected  with Sabrina Ruiz on 03/06/23 at  9:00 AM EDT by a video enabled telemedicine application and verified that I am speaking with the correct person using two identifiers.  Location: Patient: Home Provider: Home Office   I discussed the limitations of evaluation and management by telemedicine and the availability of in person appointments. The patient expressed understanding and agreed to proceed.   I discussed the assessment and treatment plan with the patient. The patient was provided an opportunity to ask questions and all were answered. The patient  agreed with the plan and demonstrated an understanding of the instructions.   The patient was advised to call back or seek an in-person evaluation if the symptoms worsen or if the condition fails to improve as anticipated.  I provided 30 minutes of non-face-to-face time during this encounter.   Stasia Cavalier, MD

## 2023-04-10 ENCOUNTER — Encounter (HOSPITAL_COMMUNITY): Payer: Self-pay | Admitting: Psychiatry

## 2023-04-10 ENCOUNTER — Telehealth (HOSPITAL_BASED_OUTPATIENT_CLINIC_OR_DEPARTMENT_OTHER): Payer: 59 | Admitting: Psychiatry

## 2023-04-10 DIAGNOSIS — F411 Generalized anxiety disorder: Secondary | ICD-10-CM

## 2023-04-10 DIAGNOSIS — F909 Attention-deficit hyperactivity disorder, unspecified type: Secondary | ICD-10-CM | POA: Diagnosis not present

## 2023-04-10 DIAGNOSIS — F320 Major depressive disorder, single episode, mild: Secondary | ICD-10-CM | POA: Diagnosis not present

## 2023-04-10 MED ORDER — ESCITALOPRAM OXALATE 5 MG PO TABS: 5.0000 mg | ORAL_TABLET | Freq: Every day | ORAL | 1 refills | Status: DC

## 2023-04-10 MED ORDER — LISDEXAMFETAMINE DIMESYLATE 20 MG PO CAPS: 20.0000 mg | ORAL_CAPSULE | Freq: Every day | ORAL | 0 refills | Status: DC

## 2023-04-10 NOTE — Progress Notes (Signed)
BH MD/PA/NP OP Progress Note  04/10/2023 10:05 AM Sabrina Pierce  MRN:  [id number]  Visit Diagnosis:    ICD-10-CM   1. GAD (generalized anxiety disorder)  F41.1 escitalopram (LEXAPRO) 5 MG tablet    2. Attention deficit hyperactivity disorder (ADHD), unspecified ADHD type  F90.9 lisdexamfetamine (VYVANSE) 20 MG capsule    3. Current mild episode of major depressive disorder without prior episode (HCC)  F32.0 escitalopram (LEXAPRO) 5 MG tablet      Assessment: Sabrina Pierce is a 22 y.o. female with a history of reported ADHD who presented to [facility] at [facility] for initial evaluation on 02/02/2023.  During initial evaluation patient reported neurovegetative symptoms of depression including low mood, anhedonia, worthlessness, disturbed sleep, fluctuating appetite, poor concentration, and passive SI without intent or plan.  She also endorsed significant symptoms of anxiety including constant worry, irritability, restlessness, and difficulty relaxing that has impacted her physical health.  Patient does have a prior diagnosis of ADHD completed neuropsych testing at [facility] in.  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  Patient meets criteria for MDD and generalized anxiety disorder at this time with likely ADHD which will be confirmed through neuropsych testing.  [name] presents for follow-up evaluation. Today, 04/10/23, patient reports continued difficulty in taking medications consistently due to family pressure. Which in turn has had a negative impact on her anxiety/depression. She has been taking Lexapro intermittently and the Vyvanse was stopped after she completed the semester.  Patient notes experiencing some decreased appetite when taking Lexapro which her family has critiqued.  We reviewed the potential side effects and the ability for dividing to just once gets acclimated to the medication.  Patient was  encouraged to take the Lexapro consistently in order to give her body time to become accustomed to the Lexapro.  In regards to the Vyvanse that she had noticed some improvement in hyperactivity with a couple episodes of increased irritability.  She denied any significant changes in concentration.  We recommended titrating to an effective dose prior to restarting school in the fall.  Risk and benefits of this were discussed.  Plan: - Increase Vyvanse to 20 mg QD for ADHD  - Continue Lexapro 5 mg QD for anxiety - Continue therapy  - CMP, CBC, lipid profile reviewed - Neuropsych testing reviewed - Crisis resources reviewed - Follow up in 1 month   Chief Complaint:  Chief Complaint  Patient presents with   Follow-up   HPI: Sabrina Pierce presents reporting that she has not been doing well, she had 3 emotional breakdowns in the past month with the last one being yesterday.  Patient notes that she has had a lot of trouble with the age and side of her family.  They have been criticizing her for various things including taking medications, cooking too much, cooking too little, or her appetite.  Patient notes that while recently they have been openly told her to stop taking medications they still can pressure her saying that she is acting different on the medication or that she can do this without them.  Due to this pressure [name] was only taking Lexapro occasionally, she also she stopped the Vyvanse when school finished.  In regards to Lexapro patient reports that she has noticed some decreased appetite from it which is one of the reasons that she stopped because her family was pressuring her on how much she was eating.  We explained that while  it is a potential side effect it can decrease as we adapt to the medication.  However by stopping and restarting it we are unable to give her body time to adjust.  As for the Vyvanse patient reported no noticeable change in her concentration or focus on the 10 mg dose.  She  did notice that she was less talkative which was good, however also had a couple episodes of increased irritability.  She had discontinued this when after finishing school as her family pressure her saying that is not needed now that she is not attending school.  We reviewed the potential side effects of the medication as well as the possible need to titrate the medication to a more effective dose.  We also reviewed taking medication holidays during times we do not need it.  Patient acknowledges that her attention and focus is better outside of school however still not perfect.  As we have yet to find the appropriate dose we discussed titrating Vyvanse with a goal of reaching effective dose before she restarts school in the fall.  Patient also notes that she is connected with a therapist who she sees monthly currently.  She notes that the sessions are helpful and she plans to discuss some of the family dynamic issues there.  We did review the importance of setting boundaries and the possibility of setting small boundaries initially with the goal of working up to setting one in regards to taking medications.  Past Psychiatric History: Patient denies any prior psychiatric care other than being diagnosed with ADHD in middle school.  She had been started on Adderall and continued that medication until she took a break from college.  At college she had met with a therapist a couple times but felt was a poor connection.  Patient has tried Adderall extended release 15 mg in the past.  Currently on Lexapro 5 mg and Vyvanse 20 mg.  Patient denies any substance use.  Past Medical History:  Past Medical History:  Diagnosis Date   Abdominal pain    Constipation    Seasonal allergies     Past Surgical History:  Procedure Laterality Date   GASTROSCHISIS CLOSURE      Family History:  Family History  Problem Relation Age of Onset   Cholelithiasis Maternal Aunt    Cholelithiasis Maternal Grandmother      Social History:  Social History   Socioeconomic History   Marital status: Single    Spouse name: Not on file   Number of children: Not on file   Years of education: Not on file   Highest education level: Not on file  Occupational History   Not on file  Tobacco Use   Smoking status: Never   Smokeless tobacco: Never  Vaping Use   Vaping Use: Never used  Substance and Sexual Activity   Alcohol use: Never   Drug use: Never   Sexual activity: Never    Birth control/protection: None  Other Topics Concern   Not on file  Social History Narrative   4th grade   Social Determinants of Health   Financial Resource Strain: Not on file  Food Insecurity: Not on file  Transportation Needs: Not on file  Physical Activity: Not on file  Stress: Not on file  Social Connections: Not on file    Allergies: No Known Allergies  Current Medications: Current Outpatient Medications  Medication Sig Dispense Refill   escitalopram (LEXAPRO) 5 MG tablet Take 1 tablet (5 mg total) by mouth  daily. 30 tablet 1   lisdexamfetamine (VYVANSE) 20 MG capsule Take 1 capsule (20 mg total) by mouth daily. 30 capsule 0   polyethylene glycol (MIRALAX / GLYCOLAX) 17 g packet Take 17 g by mouth 2 (two) times daily as needed. (Patient not taking: Reported on 04/19/2022) 14 each 0   No current facility-administered medications for this visit.     Psychiatric Specialty Exam: Review of Systems  There were no vitals taken for this visit.There is no height or weight on file to calculate BMI.  General Appearance: Fairly Groomed  Eye Contact:  Good  Speech:  Clear and Coherent  Volume:  Normal  Mood:  Anxious  Affect:  Congruent  Thought Process:  Goal Directed  Orientation:  Full (Time, Place, and Person)  Thought Content: Logical   Suicidal Thoughts:  No  Homicidal Thoughts:  No  Memory:  Immediate;   Good  Judgement:  Fair  Insight:  Fair  Psychomotor Activity:  Normal  Concentration:   Concentration: Fair  Recall:  Good  Fund of Knowledge: Fair  Language: Good  Akathisia:  NA    AIMS (if indicated): not done  Assets:  Communication Skills Desire for Improvement Financial Resources/Insurance Housing Physical Health Social Support Talents/Skills Transportation Vocational/Educational  ADL's:  Intact  Cognition: WNL  Sleep:  Good   Metabolic Disorder Labs: Lab Results  Component Value Date   HGBA1C 5.4 04/19/2022   No results found for: "PROLACTIN" Lab Results  Component Value Date   CHOL 140 04/19/2022   TRIG 137.0 04/19/2022   HDL 33.00 (L) 04/19/2022   CHOLHDL 4 04/19/2022   VLDL 27.4 04/19/2022   LDLCALC 79 04/19/2022   No results found for: "TSH"  Therapeutic Level Labs: No results found for: "LITHIUM" No results found for: "VALPROATE" No results found for: "CBMZ"   Screenings: GAD-7    Flowsheet Row [facility]  Total GAD-7 Score 8      PHQ2-9    Flowsheet Row Office Visit from 02/02/2023 in [facility] Office Visit from 12/14/2022 in [facility] [place] Office Visit from 07/25/2022 in [facility] [place] Office Visit from 04/19/2022 in [facility] [place]  PHQ-2 Total Score 2 0 1 1  PHQ-9 Total Score 14 2 4  --      Flowsheet Row Office Visit from 02/02/2023 in [facility] from 01/24/2023 in [facility] Emergency Department at [facility] ED to Hosp-Admission (Discharged) from 01/04/2022 in [facility] [place] [facility]  C-SSRS RISK CATEGORY No Risk No Risk No Risk       Collaboration of Care: Collaboration of Care: Medication Management AEB medication prescription  Patient/Guardian was advised Release of Information must be obtained prior to any record release in order to collaborate their care with an  outside provider. Patient/Guardian was advised if they have not already done so to contact the registration department to sign all necessary forms in order for [place] to release information regarding their care.   Consent: Patient/Guardian gives verbal consent for treatment and assignment of benefits for services provided during this visit. Patient/Guardian expressed understanding and agreed to proceed.    Stasia Cavalier, MD 04/10/2023, 10:05 AM  40 minutes were spent in chart review, interview, psycho education, counseling, medical decision making, coordination of care and long-term prognosis.  Patient was given opportunity to ask question and  all concerns and questions were addressed and answers. Excluding separately billable services.    Virtual Visit via Video Note  I connected with Tacey Ruiz on 04/10/23 at  9:00 AM EDT by a video enabled telemedicine application and verified that I am speaking with the correct person using two identifiers.  Location: Patient: Home Provider: Home Office   I discussed the limitations of evaluation and management by telemedicine and the availability of in person appointments. The patient expressed understanding and agreed to proceed.   I discussed the assessment and treatment plan with the patient. The patient was provided an opportunity to ask questions and all were answered. The patient agreed with the plan and demonstrated an understanding of the instructions.   The patient was advised to call back or seek an in-person evaluation if the symptoms worsen or if the condition fails to improve as anticipated.  I provided 35 minutes of non-face-to-face time during this encounter.   Stasia Cavalier, MD

## 2023-05-11 ENCOUNTER — Telehealth (HOSPITAL_BASED_OUTPATIENT_CLINIC_OR_DEPARTMENT_OTHER): Payer: 59 | Admitting: Psychiatry

## 2023-05-11 ENCOUNTER — Encounter (HOSPITAL_COMMUNITY): Payer: Self-pay | Admitting: Psychiatry

## 2023-05-11 DIAGNOSIS — F320 Major depressive disorder, single episode, mild: Secondary | ICD-10-CM

## 2023-05-11 DIAGNOSIS — F909 Attention-deficit hyperactivity disorder, unspecified type: Secondary | ICD-10-CM | POA: Diagnosis not present

## 2023-05-11 DIAGNOSIS — F411 Generalized anxiety disorder: Secondary | ICD-10-CM | POA: Diagnosis not present

## 2023-05-11 MED ORDER — LISDEXAMFETAMINE DIMESYLATE 20 MG PO CAPS: 20.0000 mg | ORAL_CAPSULE | Freq: Every day | ORAL | 0 refills | Status: DC

## 2023-05-11 MED ORDER — ESCITALOPRAM OXALATE 5 MG PO TABS: 5.0000 mg | ORAL_TABLET | Freq: Every day | ORAL | 1 refills | Status: DC

## 2023-05-11 NOTE — Progress Notes (Signed)
BH MD/PA/NP OP Progress Note  05/11/2023 9:53 AM Sabrina Pierce  MRN:  [id number]  Visit Diagnosis:    ICD-10-CM   1. Attention deficit hyperactivity disorder (ADHD), unspecified ADHD type  F90.9 lisdexamfetamine (VYVANSE) 20 MG capsule    2. GAD (generalized anxiety disorder)  F41.1 escitalopram (LEXAPRO) 5 MG tablet    3. Current mild episode of major depressive disorder without prior episode (HCC)  F32.0 escitalopram (LEXAPRO) 5 MG tablet       Assessment: Sabrina Pierce is a 22 y.o. female with a history of reported ADHD who presented to [facility] at [facility] for initial evaluation on 02/02/2023.  During initial evaluation patient reported neurovegetative symptoms of depression including low mood, anhedonia, worthlessness, disturbed sleep, fluctuating appetite, poor concentration, and passive SI without intent or plan.  She also endorsed significant symptoms of anxiety including constant worry, irritability, restlessness, and difficulty relaxing that has impacted her physical health.  Patient does have a prior diagnosis of ADHD completed neuropsych testing at [facility] in.  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  Patient meets criteria for MDD and generalized anxiety disorder at this time with likely ADHD which will be confirmed through neuropsych testing.  Sabrina Pierce presents for follow-up evaluation. Today, 05/11/23, patient reports that the last month has gone pretty well for her.  The increase in Vyvanse had a substantial improvement in her memory, concentration, and ability to complete tasks.  She did note headaches when she took it every day, which improved when taking it every other day.  In regards to Lexapro she is still working to take it consistently though noted that the suppressed appetite improved.  Patient has switched the dosing to bedtime for better consistency.  While she did not particularly note an  improvement in anxiety she has been doing better at brushing off family concerns or unrealistic expectations from them.  We will continue on her current regimen and follow-up in 6 weeks.  Plan: - Continue Vyvanse 20 mg QD for ADHD  - Continue Lexapro 5 mg QHS for anxiety - Continue therapy  - CMP, CBC, lipid profile reviewed - Neuropsych testing reviewed - Crisis resources reviewed - Follow up in 6 weeks   Chief Complaint:  Chief Complaint  Patient presents with   Follow-up   HPI: Sabrina Pierce presents reporting that things have gone fairly well for her over the past month.  She took the Lexapro consistently in the mornings for the first 2 weeks after our last appointment and found that the suppressed appetite improved to the more she took it.  After 2 weeks she reports falling off on taking it consistently in part due to forgetfulness in the mornings and in part due to family pressure.  She notes that her family still does not necessarily have mental health and believes through prayer or therapy things will get better on her own.  Sabrina Pierce did restart taking Lexapro around a week ago in the evenings and has found it a little bit easier to take at this time.  We encouraged her to continue to take it regularly to get its full effect.  She reports that she finds the vyvanse to be awesome. Patient reports that it has really helped her to focus, and quite down the distractions in her head. She has been more productive with her work and does not have to go through her usual routine to get going.  She did report  experiencing headaches when she took the medication every day, but that improved when she takes it every other day.  Outside of this patient reports she got a job at [employer] which she enjoys.  She has also started journaling now and is accepting that she cannot only please her family.  Past Psychiatric History: Patient denies any prior psychiatric care other than being diagnosed with ADHD in middle  school.  She had been started on Adderall and continued that medication until she took a break from college.  At college she had met with a therapist a couple times but felt was a poor connection.  Patient has tried Adderall extended release 15 mg in the past.  Currently on Lexapro 5 mg and Vyvanse 20 mg.  Patient denies any substance use.  Past Medical History:  Past Medical History:  Diagnosis Date   Abdominal pain    Constipation    Seasonal allergies     Past Surgical History:  Procedure Laterality Date   GASTROSCHISIS CLOSURE      Family History:  Family History  Problem Relation Age of Onset   Cholelithiasis Maternal Aunt    Cholelithiasis Maternal Grandmother     Social History:  Social History   Socioeconomic History   Marital status: Single    Spouse name: Not on file   Number of children: Not on file   Years of education: Not on file   Highest education level: Not on file  Occupational History   Not on file  Tobacco Use   Smoking status: Never   Smokeless tobacco: Never  Vaping Use   Vaping Use: Never used  Substance and Sexual Activity   Alcohol use: Never   Drug use: Never   Sexual activity: Never    Birth control/protection: None  Other Topics Concern   Not on file  Social History Narrative   4th grade   Social Determinants of Health   Financial Resource Strain: Not on file  Food Insecurity: Not on file  Transportation Needs: Not on file  Physical Activity: Not on file  Stress: Not on file  Social Connections: Not on file    Allergies: No Known Allergies  Current Medications: Current Outpatient Medications  Medication Sig Dispense Refill   escitalopram (LEXAPRO) 5 MG tablet Take 1 tablet (5 mg total) by mouth daily. 30 tablet 1   lisdexamfetamine (VYVANSE) 20 MG capsule Take 1 capsule (20 mg total) by mouth daily. 30 capsule 0   polyethylene glycol (MIRALAX / GLYCOLAX) 17 g packet Take 17 g by mouth 2 (two) times daily as needed.  (Patient not taking: Reported on 04/19/2022) 14 each 0   No current facility-administered medications for this visit.     Psychiatric Specialty Exam: Review of Systems  There were no vitals taken for this visit.There is no height or weight on file to calculate BMI.  General Appearance: Fairly Groomed  Eye Contact:  Good  Speech:  Clear and Coherent  Volume:  Normal  Mood:  Anxious  Affect:  Congruent  Thought Process:  Goal Directed  Orientation:  Full (Time, Place, and Person)  Thought Content: Logical   Suicidal Thoughts:  No  Homicidal Thoughts:  No  Memory:  Immediate;   Good  Judgement:  Good  Insight:  Fair and improving  Psychomotor Activity:  Normal  Concentration:  Concentration: Good  Recall:  Good  Fund of Knowledge: Fair  Language: Good  Akathisia:  NA    AIMS (if indicated): not done  Assets:  Communication Skills Desire for Improvement Financial Resources/Insurance Housing Physical Health Social Support Talents/Skills Transportation Vocational/Educational  ADL's:  Intact  Cognition: WNL  Sleep:  Good   Metabolic Disorder Labs: Lab Results  Component Value Date   HGBA1C 5.4 04/19/2022   No results found for: "PROLACTIN" Lab Results  Component Value Date   CHOL 140 04/19/2022   TRIG 137.0 04/19/2022   HDL 33.00 (L) 04/19/2022   CHOLHDL 4 04/19/2022   VLDL 27.4 04/19/2022   LDLCALC 79 04/19/2022   No results found for: "TSH"  Therapeutic Level Labs: No results found for: "LITHIUM" No results found for: "VALPROATE" No results found for: "CBMZ"   Screenings: GAD-7    Flowsheet Row Office Visit from 02/02/2023 in [facility]  Total GAD-7 Score 8      PHQ2-9    Flowsheet Row Office Visit from 02/02/2023 in [facility] Office Visit from 12/14/2022 in [facility] [place] Office Visit from 07/25/2022 in [facility]  [place] Office Visit from 04/19/2022 in [facility] [place]  PHQ-2 Total Score 2 0 1 1  PHQ-9 Total Score 14 2 4  --      Flowsheet Row Office Visit from 02/02/2023 in [facility] from 01/24/2023 in [facility] Emergency Department at [facility] ED to Hosp-Admission (Discharged) from 01/04/2022 in [facility] [place] [facility]  C-SSRS RISK CATEGORY No Risk No Risk No Risk       Collaboration of Care: Collaboration of Care: Medication Management AEB medication prescription  Patient/Guardian was advised Release of Information must be obtained prior to any record release in order to collaborate their care with an outside provider. Patient/Guardian was advised if they have not already done so to contact the registration department to sign all necessary forms in order for [place] to release information regarding their care.   Consent: Patient/Guardian gives verbal consent for treatment and assignment of benefits for services provided during this visit. Patient/Guardian expressed understanding and agreed to proceed.    Stasia Cavalier, MD 05/11/2023, 9:53 AM  40 minutes were spent in chart review, interview, psycho education, counseling, medical decision making, coordination of care and long-term prognosis.  Patient was given opportunity to ask question and all concerns and questions were addressed and answers. Excluding separately billable services.    Virtual Visit via Video Note  I connected with Sabrina Pierce on 05/11/23 at  9:30 AM EDT by a video enabled telemedicine application and verified that I am speaking with the correct person using two identifiers.  Location: Patient: Home Provider: Home Office   I discussed the limitations of evaluation and management by telemedicine and the availability of in person appointments. The patient expressed understanding and agreed to proceed.   I discussed the assessment and  treatment plan with the patient. The patient was provided an opportunity to ask questions and all were answered. The patient agreed with the plan and demonstrated an understanding of the instructions.   The patient was advised to call back or seek an in-person evaluation if the symptoms worsen or if the condition fails to improve as anticipated.  I provided 35 minutes of non-face-to-face time during this encounter.   Stasia Cavalier, MD

## 2023-06-22 ENCOUNTER — Telehealth (HOSPITAL_BASED_OUTPATIENT_CLINIC_OR_DEPARTMENT_OTHER): Payer: 59 | Admitting: Psychiatry

## 2023-06-22 DIAGNOSIS — F411 Generalized anxiety disorder: Secondary | ICD-10-CM

## 2023-06-22 DIAGNOSIS — F320 Major depressive disorder, single episode, mild: Secondary | ICD-10-CM | POA: Diagnosis not present

## 2023-06-22 DIAGNOSIS — F909 Attention-deficit hyperactivity disorder, unspecified type: Secondary | ICD-10-CM | POA: Diagnosis not present

## 2023-06-22 MED ORDER — LISDEXAMFETAMINE DIMESYLATE 20 MG PO CAPS: 20.0000 mg | ORAL_CAPSULE | Freq: Every day | ORAL | 0 refills | Status: DC

## 2023-06-22 MED ORDER — ESCITALOPRAM OXALATE 5 MG PO TABS: 5.0000 mg | ORAL_TABLET | Freq: Every day | ORAL | 1 refills | Status: DC

## 2023-06-22 MED ORDER — HYDROXYZINE HCL 10 MG PO TABS: 10.0000 mg | ORAL_TABLET | Freq: Three times a day (TID) | ORAL | 0 refills | Status: DC | PRN

## 2023-06-22 NOTE — Progress Notes (Unsigned)
BH MD/PA/NP OP Progress Note  06/23/2023 8:03 AM Sabrina Pierce  MRN:  [id number]  Visit Diagnosis:    ICD-10-CM   1. Attention deficit hyperactivity disorder (ADHD), unspecified ADHD type  F90.9 lisdexamfetamine (VYVANSE) 20 MG capsule    2. GAD (generalized anxiety disorder)  F41.1 hydrOXYzine (ATARAX) 10 MG tablet    lisdexamfetamine (VYVANSE) 20 MG capsule    escitalopram (LEXAPRO) 5 MG tablet    3. Current mild episode of major depressive disorder without prior episode (HCC)  F32.0 escitalopram (LEXAPRO) 5 MG tablet        Assessment: Sabrina Pierce is a 22 y.o. female with a history of reported ADHD who presented to [facility] at [facility] for initial evaluation on 02/02/2023.  During initial evaluation patient reported neurovegetative symptoms of depression including low mood, anhedonia, worthlessness, disturbed sleep, fluctuating appetite, poor concentration, and passive SI without intent or plan.  She also endorsed significant symptoms of anxiety including constant worry, irritability, restlessness, and difficulty relaxing that has impacted her physical health.  Patient does have a prior diagnosis of ADHD completed neuropsych testing at [facility] in.  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  Patient meets criteria for MDD and generalized anxiety disorder at this time with likely ADHD which will be confirmed through neuropsych testing.  Sabrina Pierce presents for follow-up evaluation. Today, 06/23/23, patient reports that she has had some improvement in her depressive and anxiety symptoms over the past month since taking Lexapro consistently.  Patient reports that her mood is less labile and she is less reactive/tearful compared to the past.  Patient also notes anxiety is improved and she is not always feeling hypervigilant or on edge.  She does continue to have ruminations and is worried about anxiety levels when  school starts.  Discussed options including increasing Lexapro at an as needed medications.  Patient is not interested in increasing Lexapro at this time however was open to starting hydroxyzine 10 mg 3 times a day as needed for anxiety.  Risk and benefits of this medication were discussed.  In regards to ADHD symptoms patient reports improvement when she does take her Vyvanse.  Over the summer when she is not in school however she has not been taking it as frequently.  Plan: - Continue Vyvanse 20 mg QD for ADHD  - Continue Lexapro 5 mg QHS for anxiety - Start Atarax 10 mg TID prn for anxiety - Continue therapy  - CMP, CBC, lipid profile reviewed - Neuropsych testing reviewed - Crisis resources reviewed - Follow up in 6 weeks   Chief Complaint:  Chief Complaint  Patient presents with   Follow-up   HPI: Sabrina Pierce presents reporting that she has been taking the Lexapro regularly and since so feels that her mood is not as labile. She feels that the reactive emotions she had used to experience in the past are not as significant. This is evidenced by her crying less compared to the past. The anxiety has also improved as she is not always on the edge. There is still some anxiety, but she has been working on it through therapy.   Patient does ruminate on how her anxiety is going to be when she restarts school. Patient endorsed a lot of hypothetical scenarios about her anxiety. We discussed increasing Lexapro however she was not interested in that at this time.  Patient was interested in trying as needed medication in part due to being  hopeful to eventually get off of the Lexapro.  Discussed options including propranolol and hydroxyzine.  Patient was interested in hydroxyzine and risk and benefits were reviewed.  Past Psychiatric History: Patient denies any prior psychiatric care other than being diagnosed with ADHD in middle school.  She had been started on Adderall and continued that medication until she  took a break from college.  At college she had met with a therapist a couple times but felt was a poor connection.  Patient has tried Adderall extended release 15 mg in the past.  Currently on Lexapro 5 mg and Vyvanse 20 mg.  Patient denies any substance use.  Past Medical History:  Past Medical History:  Diagnosis Date   Abdominal pain    Constipation    Seasonal allergies     Past Surgical History:  Procedure Laterality Date   GASTROSCHISIS CLOSURE      Family History:  Family History  Problem Relation Age of Onset   Cholelithiasis Maternal Aunt    Cholelithiasis Maternal Grandmother     Social History:  Social History   Socioeconomic History   Marital status: Single    Spouse name: Not on file   Number of children: Not on file   Years of education: Not on file   Highest education level: Not on file  Occupational History   Not on file  Tobacco Use   Smoking status: Never   Smokeless tobacco: Never  Vaping Use   Vaping status: Never Used  Substance and Sexual Activity   Alcohol use: Never   Drug use: Never   Sexual activity: Never    Birth control/protection: None  Other Topics Concern   Not on file  Social History Narrative   4th grade   Social Determinants of Health   Financial Resource Strain: Not on file  Food Insecurity: Not on file  Transportation Needs: Not on file  Physical Activity: Not on file  Stress: Not on file  Social Connections: Unknown (04/10/2022)   Received from [facility]   Social Network    Social Network: Not on file    Allergies: No Known Allergies  Current Medications: Current Outpatient Medications  Medication Sig Dispense Refill   hydrOXYzine (ATARAX) 10 MG tablet Take 1 tablet (10 mg total) by mouth 3 (three) times daily as needed for anxiety. 30 tablet 0   escitalopram (LEXAPRO) 5 MG tablet Take 1 tablet (5 mg total) by mouth daily. 30 tablet 1   lisdexamfetamine (VYVANSE) 20 MG capsule Take 1 capsule  (20 mg total) by mouth daily. 30 capsule 0   polyethylene glycol (MIRALAX / GLYCOLAX) 17 g packet Take 17 g by mouth 2 (two) times daily as needed. (Patient not taking: Reported on 04/19/2022) 14 each 0   No current facility-administered medications for this visit.     Psychiatric Specialty Exam: Review of Systems  There were no vitals taken for this visit.There is no height or weight on file to calculate BMI.  General Appearance: Fairly Groomed  Eye Contact:  Good  Speech:  Clear and Coherent  Volume:  Normal  Mood:  Anxious  Affect:  Congruent  Thought Process:  Goal Directed  Orientation:  Full (Time, Place, and Person)  Thought Content: Logical and Rumination   Suicidal Thoughts:  No  Homicidal Thoughts:  No  Memory:  Immediate;   Good  Judgement:  Good  Insight:  Fair and improving  Psychomotor Activity:  Normal  Concentration:  Concentration: Good  Recall:  [place] Major of Knowledge: Fair  Language: Good  Akathisia:  NA    AIMS (if indicated): not done  Assets:  Communication Skills Desire for Improvement Financial Resources/Insurance Housing Physical Health Social Support Talents/Skills Transportation Vocational/Educational  ADL's:  Intact  Cognition: WNL  Sleep:  Good   Metabolic Disorder Labs: Lab Results  Component Value Date   HGBA1C 5.4 04/19/2022   No results found for: "PROLACTIN" Lab Results  Component Value Date   CHOL 140 04/19/2022   TRIG 137.0 04/19/2022   HDL 33.00 (L) 04/19/2022   CHOLHDL 4 04/19/2022   VLDL 27.4 04/19/2022   LDLCALC 79 04/19/2022   No results found for: "TSH"  Therapeutic Level Labs: No results found for: "LITHIUM" No results found for: "VALPROATE" No results found for: "CBMZ"   Screenings: GAD-7    Flowsheet Row [facility]  Total GAD-7 Score 8      PHQ2-9    Flowsheet Row [facility] Visit from 12/14/2022 in [facility] [place] [facility] [place] Office Visit from 07/25/2022 in [facility] [place] Office Visit from 04/19/2022 in [facility] [place]  PHQ-2 Total Score 2 0 1 1  PHQ-9 Total Score 14 2 4  --      Flowsheet Row Office Visit from 02/[facility]23 [facility] from 01/24/2023 in [facility] Department at [place] [facility] ED to Hosp-Admission (Discharged) from 01/04/2022 in [facility] [place] [facility]  C-SSRS RISK CATEGORY No Risk No Risk No Risk       Collaboration of Care: Collaboration of Care: Medication Management AEB medication prescription  Patient/Guardian was advised Release of Information must be obtained prior to any record release in order to collaborate their care with an outside provider. Patient/Guardian was advised if they have not already done so to contact the registration department to sign all necessary forms in order for [place] to release information regarding their care.   Consent: Patient/Guardian gives verbal consent for treatment and assignment of benefits for services provided during this visit. Patient/Guardian expressed understanding and agreed to proceed.    Stasia Cavalier, MD 06/23/2023, 8:03 AM  40 minutes were spent in chart review, interview, psycho education, counseling, medical decision making, coordination of care and long-term prognosis.  Patient was given opportunity to ask question and all concerns and questions were addressed and answers. Excluding separately billable services.    Virtual Visit via Video Note  I connected with Tacey Ruiz on 06/23/23 at 10:00 AM EDT by a video enabled telemedicine application and verified that I am speaking with the correct person using two identifiers.  Location: Patient: Home Provider: Home Office   I discussed the limitations of evaluation and  management by telemedicine and the availability of in person appointments. The patient expressed understanding and agreed to proceed.   I discussed the assessment and treatment plan with the patient. The patient was provided an opportunity to ask questions and all were answered. The patient agreed with the plan and demonstrated an understanding of the instructions.   The patient was advised to call back or seek an in-person evaluation if the symptoms worsen or if the condition fails to improve as anticipated.   Stasia Cavalier, MD

## 2023-06-23 ENCOUNTER — Encounter (HOSPITAL_COMMUNITY): Payer: Self-pay | Admitting: Psychiatry

## 2023-08-29 NOTE — Progress Notes (Unsigned)
BH MD/PA/NP OP Progress Note  08/30/2023 11:31 AM Alashia Brownfield  MRN:  [id number]  Visit Diagnosis:    ICD-10-CM   1. Attention deficit hyperactivity disorder (ADHD), unspecified ADHD type  F90.9 lisdexamfetamine (VYVANSE) 20 MG capsule    2. GAD (generalized anxiety disorder)  F41.1 lisdexamfetamine (VYVANSE) 20 MG capsule    3. Current mild episode of major depressive disorder without prior episode [facility])  F32.0      Assessment: Astella Desir is a 22 y.o. female with a history of reported ADHD who presented to [facility] at [facility] for initial evaluation on 02/02/2023.  During initial evaluation patient reported neurovegetative symptoms of depression including low mood, anhedonia, worthlessness, disturbed sleep, fluctuating appetite, poor concentration, and passive SI without intent or plan.  She also endorsed significant symptoms of anxiety including constant worry, irritability, restlessness, and difficulty relaxing that has impacted her physical health.  Patient does have a prior diagnosis of ADHD completed neuropsych testing at [facility] in.  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  Patient meets criteria for MDD and generalized anxiety disorder and ADHD which was confirmed through neuropsych testing. Patient was also being tested for high functioning autism  Stacye Noori presents for follow-up evaluation. Today, 08/30/23, patient reports things have been going well since restarting school. She has felt that anxiety, attention, and concentration are well controlled. Of note she did self discontinue Lexapro in the interim. Cautioned patient on making medication changes without discussing with her provider first. We will continue to hold Lexapro and monitor for any changes in anxiety. She has continued Vyvanse well and denies any adverse side effects. Patient was also encouraged to reconnect with therapy.   Plan: -  Continue Vyvanse 20 mg QD for ADHD  - Hold Lexapro 5 mg QHS for anxiety, patient self discontinued - Continue Atarax 10 mg TID prn for anxiety - Restart therapy  - CMP, CBC, lipid profile reviewed - Neuropsych testing reviewed - Crisis resources reviewed - Follow up in 6 weeks  Chief Complaint:  Chief Complaint  Patient presents with   Follow-up   HPI: Eulalia presents reporting that she is doing very well. She is withdrawing from a course, as she had felt overloaded. She is still in 3 other classes and a lab. She is doing well in all of her classes so far this semester. She does have outside tutoring as well. Meris does intend to continue to audit the course she is withdrawing from. Patient is living with mom and dad right now. It is ok, but parents can be a bit overbearing/controlling (curfew/house chores). Aryahna has some frustration as it seems like a double standard with her and her brother.   Overall she feels that anxiety is well controlled. Takhia reports that her friends and advisors have pointed out that she seems much better now compared to last semester. Arcenia does not take negatives as extremely as she had in the past. With this she stopped taking the Lexapro around 3 weeks ago as she felt that she was getting better through therapy. She has missed several therapy sessions now and needs to reschedule. Encouraged patient to reach out to do this.  Patient is still taking Vyvanse which she has found helpful and denies any adverse side effects.   Past Psychiatric History: Patient denies any prior psychiatric care other than being diagnosed with ADHD in middle school.  She had been started on Adderall and  continued that medication until she took a break from college.  At college she had met with a therapist a couple times but felt was a poor connection.  Patient has tried Adderall extended release 15 mg in the past.  Currently on Lexapro 5 mg and Vyvanse 20 mg.  Patient denies any  substance use.  Past Medical History:  Past Medical History:  Diagnosis Date   Abdominal pain    Constipation    Seasonal allergies     Past Surgical History:  Procedure Laterality Date   GASTROSCHISIS CLOSURE      Family History:  Family History  Problem Relation Age of Onset   Cholelithiasis Maternal Aunt    Cholelithiasis Maternal Grandmother     Social History:  Social History   Socioeconomic History   Marital status: Single    Spouse name: Not on file   Number of children: Not on file   Years of education: Not on file   Highest education level: Not on file  Occupational History   Not on file  Tobacco Use   Smoking status: Never   Smokeless tobacco: Never  Vaping Use   Vaping status: Never Used  Substance and Sexual Activity   Alcohol use: Never   Drug use: Never   Sexual activity: Never    Birth control/protection: None  Other Topics Concern   Not on file  Social History Narrative   4th grade   Social Determinants of Health   Financial Resource Strain: Not on file  Food Insecurity: Not on file  Transportation Needs: Not on file  Physical Activity: Not on file  Stress: Not on file  Social Connections: Unknown (04/10/2022)   Received from [facility]   Social Network    Social Network: Not on file    Allergies: No Known Allergies  Current Medications: Current Outpatient Medications  Medication Sig Dispense Refill   escitalopram (LEXAPRO) 5 MG tablet Take 1 tablet (5 mg total) by mouth daily. 30 tablet 1   hydrOXYzine (ATARAX) 10 MG tablet Take 1 tablet (10 mg total) by mouth 3 (three) times daily as needed for anxiety. 30 tablet 0   lisdexamfetamine (VYVANSE) 20 MG capsule Take 1 capsule (20 mg total) by mouth daily. 30 capsule 0   polyethylene glycol (MIRALAX / GLYCOLAX) 17 g packet Take 17 g by mouth 2 (two) times daily as needed. (Patient not taking: Reported on 04/19/2022) 14 each 0   No current facility-administered  medications for this visit.     Psychiatric Specialty Exam: Review of Systems  There were no vitals taken for this visit.There is no height or weight on file to calculate BMI.  General Appearance: Fairly Groomed  Eye Contact:  Good  Speech:  Clear and Coherent  Volume:  Normal  Mood:  Euthymic  Affect:  Congruent  Thought Process:  Goal Directed  Orientation:  Full (Time, Place, and Person)  Thought Content: Logical and Rumination   Suicidal Thoughts:  No  Homicidal Thoughts:  No  Memory:  Immediate;   Good  Judgement:  Good  Insight:  Fair and improving  Psychomotor Activity:  Normal  Concentration:  Concentration: Good  Recall:  Good  Fund of Knowledge: Fair  Language: Good  Akathisia:  NA    AIMS (if indicated): not done  Assets:  Communication Skills Desire for Improvement Financial Resources/Insurance Housing Physical Health Social Support Talents/Skills Transportation Vocational/Educational  ADL's:  Intact  Cognition: WNL  Sleep:  Good  Metabolic Disorder Labs: Lab Results  Component Value Date   HGBA1C 5.4 04/19/2022   No results found for: "PROLACTIN" Lab Results  Component Value Date   CHOL 140 04/19/2022   TRIG 137.0 04/19/2022   HDL 33.00 (L) 04/19/2022   CHOLHDL 4 04/19/2022   VLDL 27.4 04/19/2022   LDLCALC 79 04/19/2022   No results found for: "TSH"  Therapeutic Level Labs: No results found for: "LITHIUM" No results found for: "VALPROATE" No results found for: "CBMZ"   Screenings: GAD-7    Flowsheet Row [facility]  Total GAD-7 Score 8      PHQ2-9    Flowsheet Row Office Visit from 02/02/2023 [facility] Office Visit from 12/14/2022 in [facility] [place] Office Visit from 07/25/2022 in [facility] [place] Office Visit from 04/19/2022 in [facility]  at [place]  PHQ-2 Total Score 2 0 1 1  PHQ-9 Total Score 14 2 4  --      Flowsheet Row Office Visit from 02/02/2023 [facility] from 01[facility]24/2023 in [facility] Department at [facility] ED to Hosp-Admission (Discharged) from 01/04/2022 in [facility] [place] [facility]  C-SSRS RISK CATEGORY No Risk No Risk No Risk       Collaboration of Care: Collaboration of Care: Medication Management AEB medication prescription  Patient/Guardian was advised Release of Information must be obtained prior to any record release in order to collaborate their care with an outside provider. Patient/Guardian was advised if they have not already done so to contact the registration department to sign all necessary forms in order for [place] to release information regarding their care.   Consent: Patient/Guardian gives verbal consent for treatment and assignment of benefits for services provided during this visit. Patient/Guardian expressed understanding and agreed to proceed.    Stasia Cavalier, MD 08/30/2023, 11:31 AM  40 minutes were spent in chart review, interview, psycho education, counseling, medical decision making, coordination of care and long-term prognosis.  Patient was given opportunity to ask question and all concerns and questions were addressed and answers. Excluding separately billable services.    Virtual Visit via Video Note  I connected with Tacey Ruiz on 08/30/23 at 11:00 AM EDT by a video enabled telemedicine application and verified that I am speaking with the correct person using two identifiers.  Location: Patient: Home Provider: Home Office   I discussed the limitations of evaluation and management by telemedicine and the availability of in person appointments. The patient expressed understanding and agreed to proceed.   I discussed the assessment and treatment plan with the patient. The patient was provided an opportunity to  ask questions and all were answered. The patient agreed with the plan and demonstrated an understanding of the instructions.   The patient was advised to call back or seek an in-person evaluation if the symptoms worsen or if the condition fails to improve as anticipated.   Stasia Cavalier, MD

## 2023-08-30 ENCOUNTER — Encounter (HOSPITAL_COMMUNITY): Payer: Self-pay | Admitting: Psychiatry

## 2023-08-30 ENCOUNTER — Telehealth (HOSPITAL_COMMUNITY): Payer: 59 | Admitting: Psychiatry

## 2023-08-30 DIAGNOSIS — F909 Attention-deficit hyperactivity disorder, unspecified type: Secondary | ICD-10-CM | POA: Diagnosis not present

## 2023-08-30 DIAGNOSIS — F411 Generalized anxiety disorder: Secondary | ICD-10-CM | POA: Diagnosis not present

## 2023-08-30 DIAGNOSIS — F320 Major depressive disorder, single episode, mild: Secondary | ICD-10-CM

## 2023-08-30 MED ORDER — LISDEXAMFETAMINE DIMESYLATE 20 MG PO CAPS: 20.0000 mg | ORAL_CAPSULE | Freq: Every day | ORAL | 0 refills | Status: DC

## 2023-09-22 ENCOUNTER — Emergency Department (HOSPITAL_COMMUNITY): Payer: 59

## 2023-09-22 ENCOUNTER — Other Ambulatory Visit: Payer: Self-pay

## 2023-09-22 ENCOUNTER — Emergency Department (HOSPITAL_COMMUNITY)
Admission: EM | Admit: 2023-09-22 | Discharge: 2023-09-22 | Disposition: A | Discharge status: Home / Self Care | Payer: 59 | Attending: Emergency Medicine | Admitting: Emergency Medicine

## 2023-09-22 ENCOUNTER — Encounter (HOSPITAL_COMMUNITY): Payer: Self-pay

## 2023-09-22 DIAGNOSIS — R1084 Generalized abdominal pain: Secondary | ICD-10-CM | POA: Insufficient documentation

## 2023-09-22 DIAGNOSIS — R112 Nausea with vomiting, unspecified: Secondary | ICD-10-CM | POA: Diagnosis not present

## 2023-09-22 LAB — CBC WITH DIFFERENTIAL/PLATELET
Abs Immature Granulocytes: 0.04 10*3/uL (ref 0.00–0.07)
Basophils Absolute: 0 10*3/uL (ref 0.0–0.1)
Basophils Relative: 0 %
Eosinophils Absolute: 0.1 10*3/uL (ref 0.0–0.5)
Eosinophils Relative: 1 %
HCT: 39.7 % (ref 36.0–46.0)
Hemoglobin: 13.2 g/dL (ref 12.0–15.0)
Immature Granulocytes: 0 %
Lymphocytes Relative: 21 %
Lymphs Abs: 2.2 10*3/uL (ref 0.7–4.0)
MCH: 27.7 pg (ref 26.0–34.0)
MCHC: 33.2 g/dL (ref 30.0–36.0)
MCV: 83.2 fL (ref 80.0–100.0)
Monocytes Absolute: 0.7 10*3/uL (ref 0.1–1.0)
Monocytes Relative: 6 %
Neutro Abs: 7.4 10*3/uL (ref 1.7–7.7)
Neutrophils Relative %: 72 %
Platelets: 297 10*3/uL (ref 150–400)
RBC: 4.77 MIL/uL (ref 3.87–5.11)
RDW: 12.6 % (ref 11.5–15.5)
WBC: 10.5 10*3/uL (ref 4.0–10.5)
nRBC: 0 % (ref 0.0–0.2)

## 2023-09-22 LAB — COMPREHENSIVE METABOLIC PANEL
ALT: 19 U/L (ref 0–44)
AST: 16 U/L (ref 15–41)
Albumin: 4.4 g/dL (ref 3.5–5.0)
Alkaline Phosphatase: 49 U/L (ref 38–126)
Anion gap: 9 (ref 5–15)
BUN: 9 mg/dL (ref 6–20)
CO2: 23 mmol/L (ref 22–32)
Calcium: 9.2 mg/dL (ref 8.9–10.3)
Chloride: 104 mmol/L (ref 98–111)
Creatinine, Ser: 0.69 mg/dL (ref 0.44–1.00)
GFR, Estimated: >60 mL/min (ref >60)
Glucose, Bld: 75 mg/dL (ref 70–99)
Potassium: 3.6 mmol/L (ref 3.5–5.1)
Sodium: 136 mmol/L (ref 135–145)
Total Bilirubin: 1.4 mg/dL — ABNORMAL HIGH (ref 0.3–1.2)
Total Protein: 7.3 g/dL (ref 6.5–8.1)

## 2023-09-22 LAB — URINALYSIS, W/ REFLEX TO CULTURE (INFECTION SUSPECTED)
Bacteria, UA: NONE SEEN
Bilirubin Urine: NEGATIVE
Glucose, UA: NEGATIVE mg/dL
Ketones, ur: 20 mg/dL — AB
Nitrite: NEGATIVE
Protein, ur: 30 mg/dL — AB
RBC / HPF: >50 RBC/hpf (ref 0–5)
Specific Gravity, Urine: 1.01 (ref 1.005–1.030)
pH: 7 (ref 5.0–8.0)

## 2023-09-22 LAB — LIPASE, BLOOD: Lipase: 43 U/L (ref 11–51)

## 2023-09-22 LAB — HCG, SERUM, QUALITATIVE: Preg, Serum: NEGATIVE

## 2023-09-22 MED ORDER — POLYETHYLENE GLYCOL 3350 17 G PO PACK: 17.0000 g | PACK | Freq: Every day | ORAL | 0 refills | Status: AC

## 2023-09-22 MED ORDER — ONDANSETRON HCL 4 MG/2ML IJ SOLN
4.0000 mg | Freq: Once | INTRAMUSCULAR | Status: AC
  Administered 2023-09-22: 4 mg via INTRAVENOUS
  Filled 2023-09-22: qty 2

## 2023-09-22 MED ORDER — IOHEXOL 300 MG/ML  SOLN
100.0000 mL | Freq: Once | INTRAMUSCULAR | Status: AC | PRN
  Administered 2023-09-22: 100 mL via INTRAVENOUS

## 2023-09-22 MED ORDER — SODIUM CHLORIDE 0.9 % IV BOLUS
500.0000 mL | Freq: Once | INTRAVENOUS | Status: AC
  Administered 2023-09-22: 500 mL via INTRAVENOUS

## 2023-09-22 NOTE — Discharge Instructions (Signed)
Return for any problem.  ?

## 2023-09-22 NOTE — ED Notes (Signed)
Pt aware urine sample is needed 

## 2023-09-22 NOTE — ED Provider Notes (Signed)
[place] [facility] AT [facility] Provider Note   CSN: [id number] Arrival date & time: 09/22/23  0827     History  Chief Complaint  Patient presents with   Emesis    Sabrina Pierce is a 22 y.o. female.  22 year old female with prior medical history as detailed below presents for evaluation.  Patient reports 6 days of intermittent diffuse abdominal cramping and discomfort.  This was associated with some mild nausea and 1 episode of vomiting 6 days ago.  Patient also reports some mild constipation with decreased bowel movement.  She denies fever.  She denies chest pain or shortness of breath.  Patient is still passing gas.  The history is provided by the patient and medical records.       Home Medications Prior to Admission medications   Medication Sig Start Date End Date Taking? Authorizing Provider  polyethylene glycol (MIRALAX) 17 g packet Take 17 g by mouth daily. 09/22/23  Yes Wynetta Fines, MD  escitalopram (LEXAPRO) 5 MG tablet Take 1 tablet (5 mg total) by mouth daily. 06/22/23   Stasia Cavalier, MD  hydrOXYzine (ATARAX) 10 MG tablet Take 1 tablet (10 mg total) by mouth 3 (three) times daily as needed for anxiety. 06/22/23   Stasia Cavalier, MD  lisdexamfetamine (VYVANSE) 20 MG capsule Take 1 capsule (20 mg total) by mouth daily. 08/30/23   Stasia Cavalier, MD  polyethylene glycol (MIRALAX / GLYCOLAX) 17 g packet Take 17 g by mouth 2 (two) times daily as needed. Patient not taking: Reported on 04/19/2022 01/06/22   Jacinto Halim, PA-C      Allergies    Patient has no known allergies.    Review of Systems   Review of Systems  All other systems reviewed and are negative.   Physical Exam Updated Vital Signs BP 111/79   Pulse 70   Temp 98.5 F (36.9 C) (Oral)   Resp 15   Ht 5\' 4"  (1.626 m)   Wt 70.3 kg   SpO2 98%   BMI 26.59 kg/m  Physical Exam Vitals and nursing note reviewed.  Constitutional:      General: She is not in acute  distress.    Appearance: Normal appearance. She is well-developed.  HENT:     Head: Normocephalic and atraumatic.  Eyes:     Conjunctiva/sclera: Conjunctivae normal.     Pupils: Pupils are equal, round, and reactive to light.  Cardiovascular:     Rate and Rhythm: Normal rate and regular rhythm.     Heart sounds: Normal heart sounds.  Pulmonary:     Effort: Pulmonary effort is normal. No respiratory distress.     Breath sounds: Normal breath sounds.  Abdominal:     General: There is no distension.     Palpations: Abdomen is soft.     Tenderness: There is no abdominal tenderness.  Musculoskeletal:        General: No deformity. Normal range of motion.     Cervical back: Normal range of motion and neck supple.  Skin:    General: Skin is warm and dry.  Neurological:     General: No focal deficit present.     Mental Status: She is alert and oriented to person, place, and time.     ED Results / Procedures / Treatments   Labs (all labs ordered are listed, but only abnormal results are displayed) Labs Reviewed  URINALYSIS, W/ REFLEX TO CULTURE (INFECTION SUSPECTED) - Abnormal; Notable for  the following components:      Result Value   Hgb urine dipstick MODERATE (*)    Ketones, ur 20 (*)    Protein, ur 30 (*)    Leukocytes,Ua SMALL (*)    All other components within normal limits  COMPREHENSIVE METABOLIC PANEL - Abnormal; Notable for the following components:   Total Bilirubin 1.4 (*)    All other components within normal limits  URINE CULTURE  LIPASE, BLOOD  CBC WITH DIFFERENTIAL/PLATELET  HCG, SERUM, QUALITATIVE    EKG EKG Interpretation Date/Time:  Saturday September 22 2023 09:14:28 EDT Ventricular Rate:  81 PR Interval:  164 QRS Duration:  89 QT Interval:  396 QTC Calculation: 460 R Axis:   88  Text Interpretation: Sinus rhythm Confirmed by Kristine Royal [phone]) on 09/22/2023 9:16:24 AM  Radiology CT ABDOMEN PELVIS W CONTRAST  Result Date: 09/22/2023 CLINICAL  DATA:  Abdominal pain, acute nonlocalized. Nausea and vomiting for 6 days. EXAM: CT ABDOMEN AND PELVIS WITH CONTRAST TECHNIQUE: Multidetector CT imaging of the abdomen and pelvis was performed using the standard protocol following bolus administration of intravenous contrast. RADIATION DOSE REDUCTION: This exam was performed according to the departmental dose-optimization program which includes automated exposure control, adjustment of the mA and/or kV according to patient size and/or use of iterative reconstruction technique. CONTRAST:  OMNIPAQUE IOHEXOL 300 MG/ML  SOLN COMPARISON:  CT 01/24/2023 FINDINGS: Lower chest: Lung bases are clear. Hepatobiliary: No focal hepatic lesion. Normal gallbladder. No biliary duct dilatation. Common bile duct is normal. Pancreas: Pancreas is normal Spleen: Normal spleen Adrenals/urinary tract: Adrenal glands and kidneys are normal. The ureters and bladder normal. Stomach/Bowel: Stomach, duodenum and small-bowel appear normal. There is no small bowel dilatation inflammation identified. Appendix not identified but no secondary signs of appendicitis. The colon and rectosigmoid colon are normal. Vascular/Lymphatic: Abdominal aorta is normal caliber. No periportal or retroperitoneal adenopathy. No pelvic adenopathy. Reproductive: Uterus and adnexa unremarkable. Other: No free fluid. Musculoskeletal: No aggressive osseous lesion. IMPRESSION: 1. No acute findings in the abdomen pelvis. 2. No evidence of bowel obstruction or inflammation. 3. Appendix not identified but no secondary signs of appendicitis. Electronically Signed   By: Genevive Bi M.D.   On: 09/22/2023 12:42    Procedures Procedures    Medications Ordered in ED Medications  sodium chloride 0.9 % bolus 500 mL (500 mLs Intravenous New Bag/Given 09/22/23 0933)  ondansetron (ZOFRAN) injection 4 mg (4 mg Intravenous Given 09/22/23 0934)  iohexol (OMNIPAQUE) 300 MG/ML solution 100 mL (100 mLs Intravenous  Contrast Given 09/22/23 1151)    ED Course/ Medical Decision Making/ A&P                                 Medical Decision Making Amount and/or Complexity of Data Reviewed Labs: ordered. Radiology: ordered.  Risk OTC drugs. Prescription drug management.    Medical Screen Complete  This patient presented to the ED with complaint of abdominal pain, nausea.  This complaint involves an extensive number of treatment options. The initial differential diagnosis includes, but is not limited to, metabolic abnormality, bowel obstruction, other intra-abdominal pathology, etc.  This presentation is: Acute, Chronic, Self-Limited, Previously Undiagnosed, Uncertain Prognosis, Complicated, Systemic Symptoms, and Threat to Life/Bodily Function  Patient presenting with complaint of abdominal cramping discomfort and nausea with associated vomiting x 1.  Patient appears to be mildly uncomfortable on initial exam.  With ED treatment the patient feels improved.  Screening  labs obtained are without significant abnormality.  CT imaging is also without significant abnormality.  Patient reassured by findings on ED workup.  Importance of close follow-up is stressed.  Patient understands strict return precautions.  Additional history obtained: External records from outside sources obtained and reviewed including prior ED visits and prior Inpatient records.    Lab Tests:  I ordered and personally interpreted labs.  The pertinent results include: CBC, CMP, UA, hCG, lipase   Imaging Studies ordered:  I ordered imaging studies including CT abdomen pelvis I independently visualized and interpreted obtained imaging which showed NAD I agree with the radiologist interpretation.   Cardiac Monitoring:  The patient was maintained on a cardiac monitor.  I personally viewed and interpreted the cardiac monitor which showed an underlying rhythm of: NSR   Medicines ordered:  I ordered medication  including Zofran for nausea Reevaluation of the patient after these medicines showed that the patient: improved   Problem List / ED Course:  Abdominal pain   Reevaluation:  After the interventions noted above, I reevaluated the patient and found that they have: improved  Disposition:  After consideration of the diagnostic results and the patients response to treatment, I feel that the patent would benefit from close outpatient follow-up.          Final Clinical Impression(s) / ED Diagnoses Final diagnoses:  Generalized abdominal pain    Rx / DC Orders ED Discharge Orders          Ordered    polyethylene glycol (MIRALAX) 17 g packet  Daily        09/22/23 1302              Wynetta Fines, MD 09/22/23 1434

## 2023-09-22 NOTE — ED Triage Notes (Signed)
Patient reports abdominal pain, nausea, and vomiting x 6 days. Vomiting resolved 4 days ago. Has hx of partial bowel obstruction. Patient able to pass gas, LBM was yesterday.

## 2023-09-24 LAB — URINE CULTURE: Culture: >=100000 — AB

## 2023-09-25 ENCOUNTER — Telehealth (HOSPITAL_BASED_OUTPATIENT_CLINIC_OR_DEPARTMENT_OTHER): Payer: Self-pay

## 2023-09-25 NOTE — Progress Notes (Signed)
ED Antimicrobial Stewardship Positive Culture Follow Up   Sabrina Pierce is an 22 y.o. female who presented to [facility] on 09/22/2023 with a chief complaint of  Chief Complaint  Patient presents with   Emesis    Recent Results (from the past 720 hour(s))  Urine Culture     Status: Abnormal   Collection Time: 09/22/23 10:58 AM   Specimen: Urine, Random  Result Value Ref Range Status   Specimen Description   Final    URINE, RANDOM Performed at [facility], [address] [place], [place] [address]    Special Requests   Final    NONE Reflexed from [id number] Performed at [facility], [address] [place], [place] [address]    Culture >=100,000 COLONIES/mL STAPHYLOCOCCUS SAPROPHYTICUS (A)  Final   Report Status 09/24/2023 FINAL  Final   Organism ID, Bacteria STAPHYLOCOCCUS SAPROPHYTICUS (A)  Final      Susceptibility   Staphylococcus saprophyticus - MIC*    CIPROFLOXACIN <=0.5 SENSITIVE Sensitive     GENTAMICIN <=0.5 SENSITIVE Sensitive     NITROFURANTOIN <=16 SENSITIVE Sensitive     OXACILLIN SENSITIVE Sensitive     TETRACYCLINE <=1 SENSITIVE Sensitive     VANCOMYCIN <=0.5 SENSITIVE Sensitive     TRIMETH/SULFA <=10 SENSITIVE Sensitive     CLINDAMYCIN <=0.25 SENSITIVE Sensitive     RIFAMPIN <=0.5 SENSITIVE Sensitive     Inducible Clindamycin NEGATIVE Sensitive     * >=100,000 COLONIES/mL STAPHYLOCOCCUS SAPROPHYTICUS   22 Y.O. female with chief complaint of abdominal pain, nausea and vomiting for 6 days (which resolved 4 days prior to ED visit). PMH: partial bowel obstruction. Last BM day prior to ED visit. No mention of urinary symptoms in visit notes.  CT scan: no acute findings or obstruction  Given Zofran (ondansetron) in ED which resolved nausea, and discharged with Miralax (polyethylene glycol).  [x]  Patient discharged originally without antimicrobial agent. No treatment is currently indicated.  New antibiotic prescription:  none  ED Provider: Derwood Kaplan, MD    Rickey Primus, PharmD Candidate [employer] Class of 2025 09/25/2023, 10:08 AM Clinical Pharmacist Monday - Friday phone -  [phone] Saturday - Sunday phone - [phone]

## 2023-09-25 NOTE — Telephone Encounter (Signed)
Post ED Visit - Positive Culture Follow-up  Culture report reviewed by antimicrobial stewardship pharmacist: [facility] []  Enzo Bi, Pharm.D. []  Celedonio Miyamoto, Pharm.D., BCPS AQ-ID []  Garvin Fila, Pharm.D., BCPS []  Georgina Pillion, Pharm.D., BCPS []  Fowler, [address].D., BCPS, AAHIVP []  Estella Husk, Pharm.D., BCPS, AAHIVP []  Lysle Pearl, PharmD, BCPS []  Phillips Climes, PharmD, BCPS []  Agapito Games, PharmD, BCPS []  Verlan Friends, PharmD []  Mervyn Gay, PharmD, BCPS []  Vinnie Level, PharmD  [facility] [x]  Roslyn Smiling, PharmD []  Greer Pickerel, PharmD []  Adalberto Cole, PharmD []  Perlie Gold, Rph []  Lonell Face) Jean Rosenthal, PharmD []  Earl Many, PharmD []  Junita Push, PharmD []  Dorna Leitz, PharmD []  Terrilee Files, PharmD []  Lynann Beaver, PharmD []  Keturah Barre, PharmD []  Loralee Pacas, PharmD []  Bernadene Person, PharmD   Positive urine culture Not treated, no urinary s/s and no need to treat. no further patient follow-up is required at this time.  Sandria Senter 09/25/2023, 10:31 AM

## 2023-10-08 NOTE — Progress Notes (Unsigned)
BH MD/PA/NP OP Progress Note  10/09/2023 12:30 PM Sabrina Pierce  MRN:  [id number]  Visit Diagnosis:    ICD-10-CM   1. Attention deficit hyperactivity disorder (ADHD), unspecified ADHD type  F90.9     2. GAD (generalized anxiety disorder)  F41.1 propranolol (INDERAL) 10 MG tablet    DISCONTINUED: propranolol (INDERAL) 10 MG tablet    3. Current mild episode of major depressive disorder without prior episode (HCC)  F32.0 propranolol (INDERAL) 10 MG tablet    DISCONTINUED: propranolol (INDERAL) 10 MG tablet      Assessment: Sabrina Pierce is a 22 y.o. female with a history of reported ADHD who presented to [facility] for initial evaluation on 02/02/2023.  During initial evaluation patient reported neurovegetative symptoms of depression including low mood, anhedonia, worthlessness, disturbed sleep, fluctuating appetite, poor concentration, and passive SI without intent or plan.  She also endorsed significant symptoms of anxiety including constant worry, irritability, restlessness, and difficulty relaxing that has impacted her physical health.  Patient does have a prior diagnosis of ADHD completed neuropsych testing at [facility] in.  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  Patient meets criteria for MDD and generalized anxiety disorder and ADHD which was confirmed through neuropsych testing. Patient was also being tested for high functioning autism  Sabrina Pierce presents for follow-up evaluation. Today, 10/09/23, patient reports experiencing a period of increased anxiety in the interim likely secondary to school performance.  This anxiety did result in the physical manifestation of a rash and lasted several weeks.  That being the case patient would benefit from restarting Lexapro however opted to hold off at this time.  Instead we will try an alternative as needed anxiety medication as she has yet to take the  hydroxyzine.  Propranolol also seems better suited to manage the specific type of anxiety she is describing.  Risk and benefits of this medication were reviewed.  We will also encourage patient to take Vyvanse more consistently around 5 days a week as she struggles with concentration, hyperfocus, and poor time management on the days that she does not take the medication.  Plan: - Continue Vyvanse 20 mg QD for ADHD  - Hold Lexapro 5 mg QHS for anxiety, patient self discontinued - Start propranolol 10 mg TID prn for anxiety - Discontinue Atarax 10 mg TID prn for anxiety - Continue therapy  - CMP, CBC, lipid profile reviewed - Neuropsych testing reviewed - Crisis resources reviewed - Follow up in a month  Chief Complaint:  Chief Complaint  Patient presents with   Follow-up   HPI: Sabrina Pierce presents reporting that she had a medium breakdown in the interim, with increased stress, racing thoughts. She had noted rash/hives present for about a month prior to this as well and she thinks it was related to her stress levels. She was able to talk with her boyfriend and felt better.  On hindsight she believes that the increase stressors related to the upcoming exams and that the symptoms seemed to improve following the disbursement of her grades.  We discussed her symptoms and overall anxiety levels.  Based off what she is expressing she could benefit from restarting on the Lexapro however patient expressed some hesitancy.  She dislikes the idea of being on medication and prefer to work on this through therapy.  Based off this we opted to hold off on Lexapro today and instead discussed as needed options.  She  has not taken the hydroxyzine yet and we agreed to discontinue this and start propranolol instead.  Risk and benefits including the positive effects he can have for test anxiety were reviewed.  In regards to her ADHD symptoms patient notes that there is benefit when she takes the Vyvanse.  She currently only  uses it 3 days a week when she has class.  On the days she does not take it or before taking it even on school days she notices that she struggles with poor concentration, hyperfocus, and time management.  Based off patient's description we reviewed the possibility of taking the dose more frequently perhaps 5 days a week.  She was willing to give this a try and see if there was any notable improvement.  Patient did complete her neuropsych testing for ADHD and autism and will be sending in the results.  Past Psychiatric History: Patient denies any prior psychiatric care other than being diagnosed with ADHD in middle school.  She had been started on Adderall and continued that medication until she took a break from college.  At college she had met with a therapist a couple times but felt was a poor connection.  Patient has tried Adderall extended release 15 mg in the past.  Currently on Lexapro 5 mg and Vyvanse 20 mg.  Patient denies any substance use.  Past Medical History:  Past Medical History:  Diagnosis Date   Abdominal pain    Constipation    Seasonal allergies     Past Surgical History:  Procedure Laterality Date   GASTROSCHISIS CLOSURE      Family History:  Family History  Problem Relation Age of Onset   Cholelithiasis Maternal Aunt    Cholelithiasis Maternal Grandmother     Social History:  Social History   Socioeconomic History   Marital status: Single    Spouse name: Not on file   Number of children: Not on file   Years of education: Not on file   Highest education level: Not on file  Occupational History   Not on file  Tobacco Use   Smoking status: Never   Smokeless tobacco: Never  Vaping Use   Vaping status: Never Used  Substance and Sexual Activity   Alcohol use: Never   Drug use: Never   Sexual activity: Never    Birth control/protection: None  Other Topics Concern   Not on file  Social History Narrative   4th grade   Social Determinants of Health    Financial Resource Strain: Not on file  Food Insecurity: Not on file  Transportation Needs: Not on file  Physical Activity: Not on file  Stress: Not on file  Social Connections: Unknown (04/10/2022)   [facility] from [facility]   Social Network    Social Network: Not on file    Allergies: No Known Allergies  Current Medications: Current Outpatient Medications  Medication Sig Dispense Refill   escitalopram (LEXAPRO) 5 MG tablet Take 1 tablet (5 mg total) by mouth daily. 30 tablet 1   lisdexamfetamine (VYVANSE) 20 MG capsule Take 1 capsule (20 mg total) by mouth daily. 30 capsule 0   polyethylene glycol (MIRALAX / GLYCOLAX) 17 g packet Take 17 g by mouth 2 (two) times daily as needed. (Patient not taking: Reported on 04/19/2022) 14 each 0   polyethylene glycol (MIRALAX) 17 g packet Take 17 g by mouth daily. 14 each 0   propranolol (INDERAL) 10 MG tablet Take 1 tablet (10 mg total)  by mouth 3 (three) times daily as needed. 90 tablet 2   No current facility-administered medications for this visit.     Psychiatric Specialty Exam: Review of Systems  There were no vitals taken for this visit.There is no height or weight on file to calculate BMI.  General Appearance: Fairly Groomed  Eye Contact:  Good  Speech:  Clear and Coherent  Volume:  Normal  Mood:  Anxious and Euthymic  Affect:  Congruent  Thought Process:  Goal Directed  Orientation:  Full (Time, Place, and Person)  Thought Content: Logical and Rumination   Suicidal Thoughts:  No  Homicidal Thoughts:  No  Memory:  Immediate;   Good  Judgement:  Good  Insight:  Fair  Psychomotor Activity:  Normal  Concentration:  Concentration: Fair  Recall:  Good  Fund of Knowledge: Fair  Language: Good  Akathisia:  NA    AIMS (if indicated): not done  Assets:  Communication Skills Desire for Improvement Financial Resources/Insurance Housing Physical Health Social  Support Talents/Skills Transportation Vocational/Educational  ADL's:  Intact  Cognition: WNL  Sleep:  Good   Metabolic Disorder Labs: Lab Results  Component Value Date   HGBA1C 5.4 04/19/2022   No results found for: "PROLACTIN" Lab Results  Component Value Date   CHOL 140 04/19/2022   TRIG 137.0 04/19/2022   HDL 33.00 (L) 04/19/2022   CHOLHDL 4 04/19/2022   VLDL 27.4 04/19/2022   LDLCALC 79 04/19/2022   No results found for: "TSH"  Therapeutic Level Labs: No results found for: "LITHIUM" No results found for: "VALPROATE" No results found for: "CBMZ"   Screenings: GAD-7    Flowsheet Row [facility]  Total GAD-7 Score 8      PHQ2-9    Flowsheet Row Office Visit from 02/02/2023 in [facility] Office Visit from 12/14/2022 in [facility] [place] Office Visit from 07/25/2022 in [facility] [place] Office Visit from 04/19/2022 in [facility] [place]  PHQ-2 Total Score 2 0 1 1  PHQ-9 Total Score 14 2 4  --      Flowsheet Row ED from 09/22/2023 in [facility] at [facility] Office Visit from 02[facility]02/2023 in [facility] ED from 01/24/2023 in [facility] Emergency Department at [facility]  C-SSRS RISK CATEGORY No Risk No Risk No Risk       Collaboration of Care: Collaboration of Care: Medication Management AEB medication prescription and Other provider involved in patient's care AEB ED chart review  Patient/Guardian was advised Release of Information must be obtained prior to any record release in order to collaborate their care with an outside provider. Patient/Guardian was advised if they have not already done so to contact the registration department to sign all necessary forms in order for [place] to release information  regarding their care.   Consent: Patient/Guardian gives verbal consent for treatment and assignment of benefits for services provided during this visit. Patient/Guardian expressed understanding and agreed to proceed.    Stasia Cavalier, MD 10/09/2023, 12:30 PM  40 minutes were spent in chart review, interview, psycho education, counseling, medical decision making, coordination of care and long-term prognosis.  Patient was given opportunity to ask question and all concerns and questions were addressed and answers. Excluding separately billable services.  Virtual Visit via Video Note  I connected with Sabrina Pierce on 10/09/23 at  11:30 AM EST by a video enabled telemedicine application and verified that I am speaking with the correct person using two identifiers.  Location: Patient: Home Provider: Home Office   I discussed the limitations of evaluation and management by telemedicine and the availability of in person appointments. The patient expressed understanding and agreed to proceed.   I discussed the assessment and treatment plan with the patient. The patient was provided an opportunity to ask questions and all were answered. The patient agreed with the plan and demonstrated an understanding of the instructions.   The patient was advised to call back or seek an in-person evaluation if the symptoms worsen or if the condition fails to improve as anticipated.   Stasia Cavalier, MD

## 2023-10-09 ENCOUNTER — Telehealth (HOSPITAL_COMMUNITY): Payer: 59 | Admitting: Psychiatry

## 2023-10-09 ENCOUNTER — Encounter (HOSPITAL_COMMUNITY): Payer: Self-pay | Admitting: Psychiatry

## 2023-10-09 DIAGNOSIS — F909 Attention-deficit hyperactivity disorder, unspecified type: Secondary | ICD-10-CM | POA: Diagnosis not present

## 2023-10-09 DIAGNOSIS — F411 Generalized anxiety disorder: Secondary | ICD-10-CM | POA: Diagnosis not present

## 2023-10-09 DIAGNOSIS — F320 Major depressive disorder, single episode, mild: Secondary | ICD-10-CM | POA: Diagnosis not present

## 2023-10-09 MED ORDER — PROPRANOLOL HCL 10 MG PO TABS: 10.0000 mg | ORAL_TABLET | Freq: Three times a day (TID) | ORAL | 2 refills | Status: DC | PRN

## 2023-11-05 NOTE — Progress Notes (Unsigned)
BH MD/PA/NP OP Progress Note  11/05/2023 9:42 AM Sabrina Pierce  MRN:  [id number]  Visit Diagnosis:  No diagnosis found.   Assessment: Sabrina Pierce is a 22 y.o. female with a history of reported ADHD who presented to [facility] at [facility] for initial evaluation on 02/02/2023.  During initial evaluation patient reported neurovegetative symptoms of depression including low mood, anhedonia, worthlessness, disturbed sleep, fluctuating appetite, poor concentration, and passive SI without intent or plan.  She also endorsed significant symptoms of anxiety including constant worry, irritability, restlessness, and difficulty relaxing that has impacted her physical health.  Patient does have a prior diagnosis of ADHD completed neuropsych testing at [facility] in.  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  Patient meets criteria for MDD and generalized anxiety disorder and ADHD which was confirmed through neuropsych testing. Patient was also being tested for high functioning autism  Sabrina Pierce presents for follow-up evaluation. Today, 11/05/23, patient reports    experiencing a period of increased anxiety in the interim likely secondary to school performance.  This anxiety did result in the physical manifestation of a rash and lasted several weeks.  That being the case patient would benefit from restarting Lexapro however opted to hold off at this time.  Instead we will try an alternative as needed anxiety medication as she has yet to take the hydroxyzine.  Propranolol also seems better suited to manage the specific type of anxiety she is describing.  Risk and benefits of this medication were reviewed.  We will also encourage patient to take Vyvanse more consistently around 5 days a week as she struggles with concentration, hyperfocus, and poor time management on the days that she does not take the medication.  Plan: - Continue Vyvanse  20 mg QD for ADHD  - Hold Lexapro 5 mg QHS for anxiety, patient self discontinued - Start propranolol 10 mg TID prn for anxiety - Discontinue Atarax 10 mg TID prn for anxiety - Continue therapy  - CMP, CBC, lipid profile reviewed - Neuropsych testing reviewed - Crisis resources reviewed - Follow up in a month  Chief Complaint:  No chief complaint on file.  HPI: Sabrina Pierce presents reporting that    she had a medium breakdown in the interim, with increased stress, racing thoughts. She had noted rash/hives present for about a month prior to this as well and she thinks it was related to her stress levels. She was able to talk with her boyfriend and felt better.  On hindsight she believes that the increase stressors related to the upcoming exams and that the symptoms seemed to improve following the disbursement of her grades.  We discussed her symptoms and overall anxiety levels.  Based off what she is expressing she could benefit from restarting on the Lexapro however patient expressed some hesitancy.  She dislikes the idea of being on medication and prefer to work on this through therapy.  Based off this we opted to hold off on Lexapro today and instead discussed as needed options.  She has not taken the hydroxyzine yet and we agreed to discontinue this and start propranolol instead.  Risk and benefits including the positive effects he can have for test anxiety were reviewed.  In regards to her ADHD symptoms patient notes that there is benefit when she takes the Vyvanse.  She currently only uses it 3 days a week when she has class.  On the days she does not  take it or before taking it even on school days she notices that she struggles with poor concentration, hyperfocus, and time management.  Based off patient's description we reviewed the possibility of taking the dose more frequently perhaps 5 days a week.  She was willing to give this a try and see if there was any notable improvement.  Patient did  complete her neuropsych testing for ADHD and autism and will be sending in the results.  Past Psychiatric History: Patient denies any prior psychiatric care other than being diagnosed with ADHD in middle school.  She had been started on Adderall and continued that medication until she took a break from college.  At college she had met with a therapist a couple times but felt was a poor connection.  Patient has tried Adderall extended release 15 mg in the past.  Currently on Lexapro 5 mg and Vyvanse 20 mg.  Patient denies any substance use.  Past Medical History:  Past Medical History:  Diagnosis Date   Abdominal pain    Constipation    Seasonal allergies     Past Surgical History:  Procedure Laterality Date   GASTROSCHISIS CLOSURE      Family History:  Family History  Problem Relation Age of Onset   Cholelithiasis Maternal Aunt    Cholelithiasis Maternal Grandmother     Social History:  Social History   Socioeconomic History   Marital status: Single    Spouse name: Not on file   Number of children: Not on file   Years of education: Not on file   Highest education level: Not on file  Occupational History   Not on file  Tobacco Use   Smoking status: Never   Smokeless tobacco: Never  Vaping Use   Vaping status: Never Used  Substance and Sexual Activity   Alcohol use: Never   Drug use: Never   Sexual activity: Never    Birth control/protection: None  Other Topics Concern   Not on file  Social History Narrative   4th grade   Social Determinants of Health   Financial Resource Strain: Not on file  Food Insecurity: Not on file  Transportation Needs: Not on file  Physical Activity: Not on file  Stress: Not on file  Social Connections: Unknown (04/10/2022)   Received from [facility]   Social Network    Social Network: Not on file    Allergies: No Known Allergies  Current Medications: Current Outpatient Medications  Medication Sig Dispense  Refill   escitalopram (LEXAPRO) 5 MG tablet Take 1 tablet (5 mg total) by mouth daily. 30 tablet 1   lisdexamfetamine (VYVANSE) 20 MG capsule Take 1 capsule (20 mg total) by mouth daily. 30 capsule 0   polyethylene glycol (MIRALAX / GLYCOLAX) 17 g packet Take 17 g by mouth 2 (two) times daily as needed. (Patient not taking: Reported on 04/19/2022) 14 each 0   polyethylene glycol (MIRALAX) 17 g packet Take 17 g by mouth daily. 14 each 0   propranolol (INDERAL) 10 MG tablet Take 1 tablet (10 mg total) by mouth 3 (three) times daily as needed. 90 tablet 2   No current facility-administered medications for this visit.     Psychiatric Specialty Exam: Review of Systems  There were no vitals taken for this visit.There is no height or weight on file to calculate BMI.  General Appearance: Fairly Groomed  Eye Contact:  Good  Speech:  Clear and Coherent  Volume:  Normal  Mood:  Anxious and Euthymic  Affect:  Congruent  Thought Process:  Goal Directed  Orientation:  Full (Time, Place, and Person)  Thought Content: Logical and Rumination   Suicidal Thoughts:  No  Homicidal Thoughts:  No  Memory:  Immediate;   Good  Judgement:  Good  Insight:  Fair  Psychomotor Activity:  Normal  Concentration:  Concentration: Fair  Recall:  Good  Fund of Knowledge: Fair  Language: Good  Akathisia:  NA    AIMS (if indicated): not done  Assets:  Communication Skills Desire for Improvement Financial Resources/Insurance Housing Physical Health Social Support Talents/Skills Transportation Vocational/Educational  ADL's:  Intact  Cognition: WNL  Sleep:  Good   Metabolic Disorder Labs: Lab Results  Component Value Date   HGBA1C 5.4 04/19/2022   No results found for: "PROLACTIN" Lab Results  Component Value Date   CHOL 140 04/19/2022   TRIG 137.0 04/19/2022   HDL 33.00 (L) 04/19/2022   CHOLHDL 4 04/19/2022   VLDL 27.4 04/19/2022   LDLCALC 79 04/19/2022   No results found for:  "TSH"  Therapeutic Level Labs: No results found for: "LITHIUM" No results found for: "VALPROATE" No results found for: "CBMZ"   Screenings: GAD-7    Flowsheet Row [facility]  Total GAD-7 Score 8      PHQ2-9    Flowsheet Row Office Visit from 02/02/2023 [facility] Office Visit from 12/14/2022 in [facility] [address] [place] [facility] [place] Office Visit from 07/25/2022 in [facility] [place] Office Visit from 04/19/2022 in [facility] at [place]  PHQ-2 Total Score 2 0 1 1  PHQ-9 Total Score 14 2 4  --      Flowsheet Row ED from 09/22/2023 in [facility] Emergency Department at [facility] Office Visit from 02/02/2023 in [facility] from 01/24/2023 in [facility] Emergency Department at [facility]  C-SSRS RISK CATEGORY No Risk No Risk No Risk       Collaboration of Care: Collaboration of Care: Medication Management AEB medication prescription and Other provider involved in patient's care AEB ED chart review  Patient/Guardian was advised Release of Information must be obtained prior to any record release in order to collaborate their care with an outside provider. Patient/Guardian was advised if they have not already done so to contact the registration department to sign all necessary forms in order for [place] to release information regarding their care.   Consent: Patient/Guardian gives verbal consent for treatment and assignment of benefits for services provided during this visit. Patient/Guardian expressed understanding and agreed to proceed.    Stasia Cavalier, MD 11/05/2023, 9:42 AM  40 minutes were spent in chart review, interview, psycho education, counseling, medical decision making, coordination of care and long-term prognosis.  Patient was given opportunity to  ask question and all concerns and questions were addressed and answers. Excluding separately billable services.  Virtual Visit via Video Note  I connected with Sabrina Pierce on 11/05/23 at  8:30 AM EST by a video enabled telemedicine application and verified that I am speaking with the correct person using two identifiers.  Location: Patient: Home Provider: Home Office   I discussed the limitations of evaluation and management by telemedicine and the availability of in person appointments. The patient expressed understanding and agreed to proceed.   I discussed the assessment and treatment plan with the patient. The patient was  provided an opportunity to ask questions and all were answered. The patient agreed with the plan and demonstrated an understanding of the instructions.   The patient was advised to call back or seek an in-person evaluation if the symptoms worsen or if the condition fails to improve as anticipated.   Stasia Cavalier, MD

## 2023-11-08 ENCOUNTER — Encounter (HOSPITAL_COMMUNITY): Payer: Self-pay | Admitting: Psychiatry

## 2023-11-08 ENCOUNTER — Telehealth (HOSPITAL_COMMUNITY): Payer: 59 | Admitting: Psychiatry

## 2023-11-08 DIAGNOSIS — F320 Major depressive disorder, single episode, mild: Secondary | ICD-10-CM | POA: Diagnosis not present

## 2023-11-08 DIAGNOSIS — F909 Attention-deficit hyperactivity disorder, unspecified type: Secondary | ICD-10-CM

## 2023-11-08 DIAGNOSIS — F411 Generalized anxiety disorder: Secondary | ICD-10-CM

## 2023-11-08 MED ORDER — LISDEXAMFETAMINE DIMESYLATE 10 MG PO CAPS
10.0000 mg | ORAL_CAPSULE | Freq: Every day | ORAL | 0 refills | Status: DC
Start: 2023-11-08 — End: 2024-02-02

## 2023-12-27 ENCOUNTER — Encounter (HOSPITAL_COMMUNITY): Payer: Self-pay | Admitting: Psychiatry

## 2023-12-27 ENCOUNTER — Telehealth (HOSPITAL_COMMUNITY): Payer: 59 | Admitting: Psychiatry

## 2023-12-27 DIAGNOSIS — F411 Generalized anxiety disorder: Secondary | ICD-10-CM | POA: Diagnosis not present

## 2023-12-27 DIAGNOSIS — F320 Major depressive disorder, single episode, mild: Secondary | ICD-10-CM | POA: Diagnosis not present

## 2023-12-27 MED ORDER — ESCITALOPRAM OXALATE 5 MG PO TABS: 5.0000 mg | ORAL_TABLET | Freq: Every day | ORAL | 2 refills | Status: AC

## 2023-12-27 MED ORDER — PROPRANOLOL HCL 10 MG PO TABS: 10.0000 mg | ORAL_TABLET | Freq: Three times a day (TID) | ORAL | 2 refills | Status: AC | PRN

## 2023-12-27 NOTE — Progress Notes (Signed)
BH MD/PA/NP OP Progress Note  12/27/2023 12:03 PM Mahealani Sulak  MRN:  [id number]  Visit Diagnosis:    ICD-10-CM   1. GAD (generalized anxiety disorder)  F41.1 propranolol (INDERAL) 10 MG tablet    escitalopram (LEXAPRO) 5 MG tablet    2. Current mild episode of major depressive disorder without prior episode (HCC)  F32.0 propranolol (INDERAL) 10 MG tablet    escitalopram (LEXAPRO) 5 MG tablet      Assessment: Demiyah Fischbach is a 23 y.o. female with a history of reported ADHD who presented to [facility] at [facility] for initial evaluation on 02/02/2023.  During initial evaluation patient reported neurovegetative symptoms of depression including low mood, anhedonia, worthlessness, disturbed sleep, fluctuating appetite, poor concentration, and passive SI without intent or plan.  She also endorsed significant symptoms of anxiety including constant worry, irritability, restlessness, and difficulty relaxing that has impacted her physical health.  Patient does have a prior diagnosis of ADHD completed neuropsych testing at [facility] in.  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  Patient meets criteria for MDD and generalized anxiety disorder and ADHD which was confirmed through neuropsych testing. Patient was also being tested for high functioning autism  Atavia Poppe presents for follow-up evaluation. Today, 12/27/23, patient reports an increase in anxiety on presentation today.  While she had not initially been able to identify this is anxiety after discussion she did feel the symptoms fit.  We did complete a GAD-7 which she scored a 17 on higher than her initial evaluation.  This being the case we did recommend restarting on the Lexapro which patient was open to.  We also encouraged her to use propranolol as needed and take the Vyvanse more consistently that she has been on for the past couple weeks.  Psychotherapeutic  interventions were used during today's session. From 11:36 AM to 12:04 PM we used supportive interviewing techniques to help support patient's medication compliance.  We also worked on helping patient better understand her sense of self with a goal of being able to accurately identify the emotion she is experiencing.  She showed some progress in this after talking through the events of the past month.  Plan: - Continue Vyvanse 10 mg QD for ADHD  - Restart Lexapro 5 mg QHS for anxiety - Continue propranolol 10 mg TID prn for anxiety - Continue therapy  - CMP, CBC, lipid profile reviewed - Neuropsych testing reviewed - Crisis resources reviewed - Follow up in a month  Chief Complaint:  Chief Complaint  Patient presents with   Follow-up   HPI: Ryland presents today initially reporting that she has been doing well without concern of significant mood or anxiety changes.  She had noted that the attention and concentration has gotten a bit worse recently though that is due to being off Vyvanse for the past week and a half and not picking up the refill.  Patient had listed positives that she has been working on improving her diet and found this beneficial for concentration.  Upon further exploration however Delaynie noted that the past month she has been dealing with feelings of being overwhelmed, excessive worrying, difficulty relaxing, increased irritability, restlessness, and fear of something awful happening.  She had listed several stressors including school, work, and her upcoming menstrual cycle, and her boyfriend being stressed which contributed to the symptoms.  Patient had initially thought that these were just related to her menstrual cycle but  upon further discussion she was able to identify that her anxiety had been increased over the past month.  While she had initially felt like just refilling the propranolol would be adequate after further exploration she agreed that resuming Lexapro might  be a better option to manage her anxiety symptoms.  Patient was agreeable to restarting this for at least 20-month period.  She is still aware that she can use propranolol as needed for episodic anxiety episodes.  Patient also does plan to restart on her Vyvanse.  Past Psychiatric History: Patient denies any prior psychiatric care other than being diagnosed with ADHD in middle school.  She had been started on Adderall and continued that medication until she took a break from college.  At college she had met with a therapist a couple times but felt was a poor connection.  Patient has tried Adderall extended release 15 mg, Atarax (oversedating), Lexapro 5 mg, in the past.  Currently on Propranolol 10 mg and Vyvanse 20 mg.  Patient denies any substance use.  Past Medical History:  Past Medical History:  Diagnosis Date   Abdominal pain    Constipation    Seasonal allergies     Past Surgical History:  Procedure Laterality Date   GASTROSCHISIS CLOSURE      Family History:  Family History  Problem Relation Age of Onset   Cholelithiasis Maternal Aunt    Cholelithiasis Maternal Grandmother     Social History:  Social History   Socioeconomic History   Marital status: Single    Spouse name: Not on file   Number of children: Not on file   Years of education: Not on file   Highest education level: Not on file  Occupational History   Not on file  Tobacco Use   Smoking status: Never   Smokeless tobacco: Never  Vaping Use   Vaping status: Never Used  Substance and Sexual Activity   Alcohol use: Never   Drug use: Never   Sexual activity: Never    Birth control/protection: None  Other Topics Concern   Not on file  Social History Narrative   4th grade   Social Drivers of Corporate investment banker Strain: Not on file  Food Insecurity: Not on file  Transportation Needs: Not on file  Physical Activity: Not on file  Stress: Not on file  Social Connections: Unknown (04/10/2022)    Received from [facility]   Social Network    Social Network: Not on file    Allergies: No Known Allergies  Current Medications: Current Outpatient Medications  Medication Sig Dispense Refill   escitalopram (LEXAPRO) 5 MG tablet Take 1 tablet (5 mg total) by mouth daily. 30 tablet 2   lisdexamfetamine (VYVANSE) 10 MG capsule Take 1 capsule (10 mg total) by mouth daily. 30 capsule 0   polyethylene glycol (MIRALAX / GLYCOLAX) 17 g packet Take 17 g by mouth 2 (two) times daily as needed. (Patient not taking: Reported on 04/19/2022) 14 each 0   polyethylene glycol (MIRALAX) 17 g packet Take 17 g by mouth daily. 14 each 0   propranolol (INDERAL) 10 MG tablet Take 1 tablet (10 mg total) by mouth 3 (three) times daily as needed. 90 tablet 2   No current facility-administered medications for this visit.     Psychiatric Specialty Exam: Review of Systems  There were no vitals taken for this visit.There is no height or weight on file to calculate BMI.  General Appearance: Fairly Groomed  Eye Contact:  Good  Speech:  Clear and Coherent  Volume:  Normal  Mood:  Euthymic  Affect:  Congruent  Thought Process:  Goal Directed  Orientation:  Full (Time, Place, and Person)  Thought Content: Logical and Rumination   Suicidal Thoughts:  No  Homicidal Thoughts:  No  Memory:  Immediate;   Good  Judgement:  Good  Insight:  Fair  Psychomotor Activity:  Normal  Concentration:  Concentration: Fair  Recall:  Good  Fund of Knowledge: Fair  Language: Good  Akathisia:  NA    AIMS (if indicated): not done  Assets:  Communication Skills Desire for Improvement Financial Resources/Insurance Housing Physical Health Social Support Talents/Skills Transportation Vocational/Educational  ADL's:  Intact  Cognition: WNL  Sleep:  Good   Metabolic Disorder Labs: Lab Results  Component Value Date   HGBA1C 5.4 04/19/2022   No results found for: "PROLACTIN" Lab Results  Component  Value Date   CHOL 140 04/19/2022   TRIG 137.0 04/19/2022   HDL 33.00 (L) 04/19/2022   CHOLHDL 4 04/19/2022   VLDL 27.4 04/19/2022   LDLCALC 79 04/19/2022   No results found for: "TSH"  Therapeutic Level Labs: No results found for: "LITHIUM" No results found for: "VALPROATE" No results found for: "CBMZ"   Screenings: GAD-7    Flowsheet Row Video Visit [facility] 12/27/2023 [facility] Office Visit from 02/02/2023 [facility]  Total GAD-7 Score 17 8      PHQ2-9    Flowsheet Row Office Visit from 02/02/2023 [facility] Office Visit from 12/14/2022 in [facility] [place] Office Visit from 07/25/2022 in [facility] [place] Office Visit from 04/19/2022 in [facility] at [place]  PHQ-2 Total Score 2 0 1 1  PHQ-9 Total Score 14 2 4  --      Flowsheet Row ED from 09/22/2023 in [facility] Emergency Department at [facility] Office Visit from 02/02/2023 in [facility] from 01/24/2023 in [facility] Emergency Department at [facility]  C-SSRS RISK CATEGORY No Risk No Risk No Risk       Collaboration of Care: Collaboration of Care: Medication Management AEB medication prescription and Other provider involved in patient's care AEB ED chart review  Patient/Guardian was advised Release of Information must be obtained prior to any record release in order to collaborate their care with an outside provider. Patient/Guardian was advised if they have not already done so to contact the registration department to sign all necessary forms in order for [place] to release information regarding their care.   Consent: Patient/Guardian gives verbal consent for treatment and assignment of benefits for services provided during this visit. Patient/Guardian  expressed understanding and agreed to proceed.    Stasia Cavalier, MD 12/27/2023, 12:03 PM  30 minutes were spent in chart review, interview, psycho education, counseling, medical decision making, coordination of care and long-term prognosis.  Patient was given opportunity to ask question and all concerns and questions were addressed and answers. Excluding separately billable services.  Virtual Visit via Video Note  I connected with Tacey Ruiz on 12/27/23 at 11:30 AM EST by a video enabled telemedicine application and verified that I am speaking with the correct person using two identifiers.  Location: Patient: Home Provider: Home Office   I discussed the limitations of evaluation and management by telemedicine and the availability of in  person appointments. The patient expressed understanding and agreed to proceed.   I discussed the assessment and treatment plan with the patient. The patient was provided an opportunity to ask questions and all were answered. The patient agreed with the plan and demonstrated an understanding of the instructions.   The patient was advised to call back or seek an in-person evaluation if the symptoms worsen or if the condition fails to improve as anticipated.   Stasia Cavalier, MD

## 2024-01-30 NOTE — Progress Notes (Signed)
 BH MD/PA/NP OP Progress Note  02/02/2024 11:25 AM Sabrina Pierce  MRN:  [id number]  Visit Diagnosis:    ICD-10-CM   1. Attention deficit hyperactivity disorder (ADHD), unspecified ADHD type  F90.9 lisdexamfetamine (VYVANSE) 10 MG capsule    2. GAD (generalized anxiety disorder)  F41.1 lisdexamfetamine (VYVANSE) 10 MG capsule       Assessment: Sabrina Pierce is a 23 y.o. female with a history of reported ADHD who presented to [facility] for initial evaluation on 02/02/2023.  During initial evaluation patient reported neurovegetative symptoms of depression including low mood, anhedonia, worthlessness, disturbed sleep, fluctuating appetite, poor concentration, and passive SI without intent or plan.  She also endorsed significant symptoms of anxiety including constant worry, irritability, restlessness, and difficulty relaxing that has impacted her physical health.  Patient does have a prior diagnosis of ADHD completed neuropsych testing at [facility] in.  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  Patient meets criteria for MDD and generalized anxiety disorder and ADHD which was confirmed through neuropsych testing. Patient was also being tested for high functioning autism  Sabrina Pierce presents for follow-up evaluation. Today, 02/02/24, patient was minimally engaged during session today as she was at work. Reported confusion about her medication regimen and thus had not started anything in the interim. Education was provided and patient was instructed to reach out if questions. Session cut short due to patients environment and her understanding of the medication regimen remains a bit uncertain. Will resend Vyvanse today at patients request. Last fill was in August of 2024.  Plan: - Continue Vyvanse 10 mg QD for ADHD  - Continue Lexapro 5 mg QHS for anxiety - Continue propranolol 10 mg TID prn for anxiety - Continue  therapy  - CMP, CBC, lipid profile reviewed - Neuropsych testing reviewed - Crisis resources reviewed - Follow up in a month  Chief Complaint:  Chief Complaint  Patient presents with   Follow-up   HPI: Sabrina Pierce presents today reporting that she was at work. She was only available to talk for a couple minutes before ending the session. During that time she reported that she was confused by her medication regimen with both Lexapro and propranolol being prescribed and thus did not start either. Patient was encouraged to reach out with questions if she has any in the future as she had not contacted this provider in the interim. Did provide education that she can take both medications simultaneously if needed. However the Lexparo is for daily use, while the propranolol was to be used more as needed for social anxiety/breakthrough anxiety. Patient still appeared to be a bit unclear on the instructions when the appointment ended.  She did request a Vyvanse refill which was sent today.   Past Psychiatric History: Patient denies any prior psychiatric care other than being diagnosed with ADHD in middle school.  She had been started on Adderall and continued that medication until she took a break from college.  At college she had met with a therapist a couple times but felt was a poor connection.  Patient has tried Adderall extended release 15 mg, Atarax (oversedating), Lexapro 5 mg, in the past.  Currently on Propranolol 10 mg and Vyvanse 20 mg.  Patient denies any substance use.  Past Medical History:  Past Medical History:  Diagnosis Date   Abdominal pain    Constipation    Seasonal allergies     Past Surgical  History:  Procedure Laterality Date   GASTROSCHISIS CLOSURE      Family History:  Family History  Problem Relation Age of Onset   Cholelithiasis Maternal Aunt    Cholelithiasis Maternal Grandmother     Social History:  Social History   Socioeconomic History   Marital status:  Single    Spouse name: Not on file   Number of children: Not on file   Years of education: Not on file   Highest education level: Not on file  Occupational History   Not on file  Tobacco Use   Smoking status: Never   Smokeless tobacco: Never  Vaping Use   Vaping status: Never Used  Substance and Sexual Activity   Alcohol use: Never   Drug use: Never   Sexual activity: Never    Birth control/protection: None  Other Topics Concern   Not on file  Social History Narrative   4th grade   Social Drivers of Corporate investment banker Strain: Not on file  Food Insecurity: Not on file  Transportation Needs: Not on file  Physical Activity: Not on file  Stress: Not on file  Social Connections: Unknown (04/10/2022)   Received from [facility]   Social Network    Social Network: Not on file    Allergies: No Known Allergies  Current Medications: Current Outpatient Medications  Medication Sig Dispense Refill   escitalopram (LEXAPRO) 5 MG tablet Take 1 tablet (5 mg total) by mouth daily. 30 tablet 2   lisdexamfetamine (VYVANSE) 10 MG capsule Take 1 capsule (10 mg total) by mouth daily. 30 capsule 0   polyethylene glycol (MIRALAX / GLYCOLAX) 17 g packet Take 17 g by mouth 2 (two) times daily as needed. (Patient not taking: Reported on 04/19/2022) 14 each 0   polyethylene glycol (MIRALAX) 17 g packet Take 17 g by mouth daily. 14 each 0   propranolol (INDERAL) 10 MG tablet Take 1 tablet (10 mg total) by mouth 3 (three) times daily as needed. 90 tablet 2   No current facility-administered medications for this visit.     Psychiatric Specialty Exam: Review of Systems  There were no vitals taken for this visit.There is no height or weight on file to calculate BMI.  General Appearance: Fairly Groomed  Eye Contact:  Good  Speech:  Clear and Coherent  Volume:  Normal  Mood:  Euthymic  Affect:  Congruent  Thought Process:  Goal Directed  Orientation:  Full (Time,  Place, and Person)  Thought Content: Logical and Rumination   Suicidal Thoughts:  No  Homicidal Thoughts:  No  Memory:  Immediate;   Good  Judgement:  Good  Insight:  Fair  Psychomotor Activity:  Normal  Concentration:  Concentration: Fair  Recall:  Good  Fund of Knowledge: Fair  Language: Good  Akathisia:  NA    AIMS (if indicated): not done  Assets:  Communication Skills Desire for Improvement Financial Resources/Insurance Housing Physical Health Social Support Talents/Skills Transportation Vocational/Educational  ADL's:  Intact  Cognition: WNL  Sleep:  Good   Metabolic Disorder Labs: Lab Results  Component Value Date   HGBA1C 5.4 04/19/2022   No results found for: "PROLACTIN" Lab Results  Component Value Date   CHOL 140 04/19/2022   TRIG 137.0 04/19/2022   HDL 33.00 (L) 04/19/2022   CHOLHDL 4 04/19/2022   VLDL 27.4 04/19/2022   LDLCALC 79 04/19/2022   No results found for: "TSH"  Therapeutic Level Labs: No results found for: "  LITHIUM" No results found for: "VALPROATE" No results found for: "CBMZ"   Screenings: GAD-7    Flowsheet Row Video [facility] Office Visit from 02/02/2023 in [facility]  Total GAD-7 Score 17 8      PHQ2-9    Flowsheet Row Office Visit from 02/02/2023 in [facility] Office Visit from 12/14/2022 in [facility] Office Visit from 07/25/2022 in [facility] Office Visit from 04/19/2022 in [facility] [place]  PHQ-2 Total Score 2 0 1 1  PHQ-9 Total Score 14 2 4  --      Flowsheet Row ED from 09/22/2023 in [facility] Emergency Department at [facility] Office Visit from 02/02/2023 in [facility] from 01/24/2023 in [facility] Department at  [facility]  C-SSRS RISK CATEGORY No Risk No Risk No Risk       Collaboration of Care: Collaboration of Care: Medication Management AEB medication prescription and Other provider involved in patient's care AEB urgent care chart review  Patient/Guardian was advised Release of Information must be obtained prior to any record release in order to collaborate their care with an outside provider. Patient/Guardian was advised if they have not already done so to contact the registration department to sign all necessary forms in order for [place] to release information regarding their care.   Consent: Patient/Guardian gives verbal consent for treatment and assignment of benefits for services provided during this visit. Patient/Guardian expressed understanding and agreed to proceed.    Stasia Cavalier, MD 02/02/2024, 11:25 AM  Virtual Visit via Video Note  I connected with Sabrina Pierce on 02/02/24 at 11:00 AM EST by a video enabled telemedicine application and verified that I am speaking with the correct person using two identifiers.  Location: Patient: Home Provider: Home Office   I discussed the limitations of evaluation and management by telemedicine and the availability of in person appointments. The patient expressed understanding and agreed to proceed.   I discussed the assessment and treatment plan with the patient. The patient was provided an opportunity to ask questions and all were answered. The patient agreed with the plan and demonstrated an understanding of the instructions.   The patient was advised to call back or seek an in-person evaluation if the symptoms worsen or if the condition fails to improve as anticipated.   I provided 5 minutes of non-face-to-face time during this encounter   Stasia Cavalier, MD

## 2024-02-02 ENCOUNTER — Telehealth (HOSPITAL_BASED_OUTPATIENT_CLINIC_OR_DEPARTMENT_OTHER): Payer: 59 | Admitting: Psychiatry

## 2024-02-02 ENCOUNTER — Encounter (HOSPITAL_COMMUNITY): Payer: Self-pay | Admitting: Psychiatry

## 2024-02-02 DIAGNOSIS — F909 Attention-deficit hyperactivity disorder, unspecified type: Secondary | ICD-10-CM | POA: Diagnosis not present

## 2024-02-02 DIAGNOSIS — F411 Generalized anxiety disorder: Secondary | ICD-10-CM | POA: Diagnosis not present

## 2024-02-02 MED ORDER — LISDEXAMFETAMINE DIMESYLATE 10 MG PO CAPS: 10.0000 mg | ORAL_CAPSULE | Freq: Every day | ORAL | 0 refills | Status: AC
